# Patient Record
Sex: Female | Born: 1985 | Race: White | Hispanic: No | Marital: Married | Smoking: Never smoker
Health system: Southern US, Community
[De-identification: ages and names within clinical notes are randomized; demographics above are authoritative.]

## PROBLEM LIST (undated history)

## (undated) DIAGNOSIS — E161 Other hypoglycemia: Secondary | ICD-10-CM

## (undated) DIAGNOSIS — N200 Calculus of kidney: Secondary | ICD-10-CM

## (undated) DIAGNOSIS — E282 Polycystic ovarian syndrome: Secondary | ICD-10-CM

## (undated) HISTORY — DX: Other hypoglycemia: E16.1

## (undated) HISTORY — PX: CHOLECYSTECTOMY: SHX55

## (undated) HISTORY — PX: URETEROLITHOTOMY: SHX71

## (undated) HISTORY — PX: OTHER SURGICAL HISTORY: SHX169

## (undated) HISTORY — PX: URETERAL STENT PLACEMENT: SHX822

---
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
## 2009-05-22 ENCOUNTER — Ambulatory Visit: Payer: Self-pay | Admitting: Cardiology

## 2009-10-05 ENCOUNTER — Ambulatory Visit: Payer: Self-pay | Admitting: Cardiology

## 2009-10-05 DIAGNOSIS — R079 Chest pain, unspecified: Secondary | ICD-10-CM

## 2009-10-05 DIAGNOSIS — R002 Palpitations: Secondary | ICD-10-CM | POA: Insufficient documentation

## 2009-10-10 ENCOUNTER — Encounter: Payer: Self-pay | Admitting: Cardiology

## 2009-10-10 ENCOUNTER — Ambulatory Visit: Payer: Self-pay | Admitting: Cardiology

## 2009-10-18 ENCOUNTER — Telehealth (INDEPENDENT_AMBULATORY_CARE_PROVIDER_SITE_OTHER): Payer: Self-pay | Admitting: *Deleted

## 2010-11-06 NOTE — Progress Notes (Signed)
Summary: Wants test results  Phone Note Call from Patient Call back at [facility] Phone [phone]   Summary of Call: Pt called regarding results of stress echo. Notified we have not received these from [facility] yet. However, I will pull this report and have Dr. Diona Browner review. Pt verbalized understanding.  Initial call taken by: Cyril Loosen, RN, BSN,  October 18, 2009 4:48 PM

## 2017-04-11 LAB — OB RESULTS CONSOLE HEPATITIS B SURFACE ANTIGEN: HEP B S AG: NEGATIVE

## 2017-04-11 LAB — OB RESULTS CONSOLE ABO/RH: RH TYPE: POSITIVE

## 2017-04-11 LAB — OB RESULTS CONSOLE GC/CHLAMYDIA
Chlamydia: NEGATIVE
Gonorrhea: NEGATIVE

## 2017-04-11 LAB — OB RESULTS CONSOLE ANTIBODY SCREEN: ANTIBODY SCREEN: NEGATIVE

## 2017-04-11 LAB — OB RESULTS CONSOLE HIV ANTIBODY (ROUTINE TESTING): HIV: NONREACTIVE

## 2017-04-11 LAB — OB RESULTS CONSOLE RPR: RPR: NONREACTIVE

## 2017-04-11 LAB — OB RESULTS CONSOLE RUBELLA ANTIBODY, IGM: Rubella: IMMUNE

## 2017-06-25 ENCOUNTER — Other Ambulatory Visit (HOSPITAL_COMMUNITY): Payer: Self-pay | Admitting: Obstetrics and Gynecology

## 2017-06-25 DIAGNOSIS — Z3689 Encounter for other specified antenatal screening: Secondary | ICD-10-CM

## 2017-06-25 DIAGNOSIS — Z3A21 21 weeks gestation of pregnancy: Secondary | ICD-10-CM

## 2017-06-25 DIAGNOSIS — O99212 Obesity complicating pregnancy, second trimester: Secondary | ICD-10-CM

## 2017-06-25 DIAGNOSIS — IMO0002 Reserved for concepts with insufficient information to code with codable children: Secondary | ICD-10-CM

## 2017-06-25 DIAGNOSIS — Z0489 Encounter for examination and observation for other specified reasons: Secondary | ICD-10-CM

## 2017-07-02 ENCOUNTER — Encounter (HOSPITAL_COMMUNITY): Payer: Self-pay | Admitting: *Deleted

## 2017-07-04 ENCOUNTER — Encounter (HOSPITAL_COMMUNITY): Payer: Self-pay

## 2017-07-04 ENCOUNTER — Other Ambulatory Visit (HOSPITAL_COMMUNITY): Payer: Self-pay | Admitting: *Deleted

## 2017-07-04 ENCOUNTER — Ambulatory Visit (HOSPITAL_COMMUNITY)
Admission: RE | Admit: 2017-07-04 | Discharge: 2017-07-04 | Disposition: A | Discharge status: Home / Self Care | Payer: BLUE CROSS/BLUE SHIELD | Source: Ambulatory Visit | Attending: Obstetrics and Gynecology | Admitting: Obstetrics and Gynecology

## 2017-07-04 DIAGNOSIS — Z3689 Encounter for other specified antenatal screening: Secondary | ICD-10-CM | POA: Diagnosis not present

## 2017-07-04 DIAGNOSIS — O99212 Obesity complicating pregnancy, second trimester: Secondary | ICD-10-CM | POA: Diagnosis not present

## 2017-07-04 DIAGNOSIS — IMO0002 Reserved for concepts with insufficient information to code with codable children: Secondary | ICD-10-CM

## 2017-07-04 DIAGNOSIS — Z3A21 21 weeks gestation of pregnancy: Secondary | ICD-10-CM | POA: Insufficient documentation

## 2017-07-04 DIAGNOSIS — Z0489 Encounter for examination and observation for other specified reasons: Secondary | ICD-10-CM

## 2017-07-04 HISTORY — DX: Polycystic ovarian syndrome: E28.2

## 2017-07-04 HISTORY — DX: Calculus of kidney: N20.0

## 2017-07-18 ENCOUNTER — Other Ambulatory Visit: Payer: Self-pay

## 2017-07-22 ENCOUNTER — Encounter (HOSPITAL_COMMUNITY): Payer: Self-pay

## 2017-08-01 ENCOUNTER — Encounter (HOSPITAL_COMMUNITY): Payer: Self-pay

## 2017-08-01 ENCOUNTER — Other Ambulatory Visit (HOSPITAL_COMMUNITY): Payer: Self-pay | Admitting: Obstetrics and Gynecology

## 2017-08-01 ENCOUNTER — Ambulatory Visit (HOSPITAL_COMMUNITY)
Admission: RE | Admit: 2017-08-01 | Discharge: 2017-08-01 | Disposition: A | Discharge status: Home / Self Care | Payer: BLUE CROSS/BLUE SHIELD | Source: Ambulatory Visit | Attending: Obstetrics and Gynecology | Admitting: Obstetrics and Gynecology

## 2017-08-01 DIAGNOSIS — Z362 Encounter for other antenatal screening follow-up: Secondary | ICD-10-CM | POA: Insufficient documentation

## 2017-08-01 DIAGNOSIS — Z0489 Encounter for examination and observation for other specified reasons: Secondary | ICD-10-CM

## 2017-08-01 DIAGNOSIS — O99212 Obesity complicating pregnancy, second trimester: Secondary | ICD-10-CM

## 2017-08-01 DIAGNOSIS — Z3689 Encounter for other specified antenatal screening: Secondary | ICD-10-CM

## 2017-08-01 DIAGNOSIS — IMO0002 Reserved for concepts with insufficient information to code with codable children: Secondary | ICD-10-CM

## 2017-08-01 DIAGNOSIS — Z3A25 25 weeks gestation of pregnancy: Secondary | ICD-10-CM | POA: Insufficient documentation

## 2017-08-04 ENCOUNTER — Other Ambulatory Visit (HOSPITAL_COMMUNITY): Payer: Self-pay | Admitting: *Deleted

## 2017-08-04 DIAGNOSIS — IMO0002 Reserved for concepts with insufficient information to code with codable children: Secondary | ICD-10-CM

## 2017-08-04 DIAGNOSIS — Z0489 Encounter for examination and observation for other specified reasons: Secondary | ICD-10-CM

## 2017-08-29 ENCOUNTER — Ambulatory Visit (HOSPITAL_COMMUNITY)
Admission: RE | Admit: 2017-08-29 | Discharge: 2017-08-29 | Disposition: A | Discharge status: Home / Self Care | Payer: BLUE CROSS/BLUE SHIELD | Source: Ambulatory Visit | Attending: Obstetrics and Gynecology | Admitting: Obstetrics and Gynecology

## 2017-08-29 ENCOUNTER — Encounter (HOSPITAL_COMMUNITY): Payer: Self-pay

## 2017-08-29 DIAGNOSIS — Z362 Encounter for other antenatal screening follow-up: Secondary | ICD-10-CM | POA: Diagnosis not present

## 2017-08-29 DIAGNOSIS — O99212 Obesity complicating pregnancy, second trimester: Secondary | ICD-10-CM | POA: Insufficient documentation

## 2017-08-29 DIAGNOSIS — Z0489 Encounter for examination and observation for other specified reasons: Secondary | ICD-10-CM

## 2017-08-29 DIAGNOSIS — Z3A29 29 weeks gestation of pregnancy: Secondary | ICD-10-CM | POA: Diagnosis not present

## 2017-08-29 DIAGNOSIS — IMO0002 Reserved for concepts with insufficient information to code with codable children: Secondary | ICD-10-CM

## 2017-10-07 NOTE — L&D Delivery Note (Signed)
Operative Delivery Note Pt reached an anterior lip dilation and was extremely uncomfortable and wished to push.  She pushed well about an hour and advanced the vertex to a +3 station.  Throughout pushing the baby had variable decelerations to the 80-90 range that would recover to 120-130 between pushes.  The last 10 minutes of pushing, the baby was not recovering fully and the vertex was visible at the perineum with pushing. Because of the prolonged variable decelerations,  I advised proceeding with a vacuum assistance to expedite delivery.  At 10:01 PM a viable female was delivered via Vaginal, Vacuum Investment banker, operational(Extractor).  Presentation: vertex; Position: Right,, Occiput,, Anterior; Station: +3.  Verbal consent: obtained from family.  Risks and benefits discussed in detail.  Risks include, but are not limited to the risks of anesthesia, bleeding, infection, damage to maternal tissues, fetal cephalhematoma.   The softcup vacuum was applied to a +3 station and inflated to green zone.  With 2 pulls and one pop-off the vertex was delivered to crowning and the patient pushed out the remainder of the baby.    APGAR: 9, 9; weight 7 lb 1.2 oz (3210 g).   Placenta status:delivered spontaneously , .   Cord:  with the following complications: tight nuchal x 1 delivered through.    Anesthesia: Epidural and local lidocaine block  Instruments: Soft cup vacuum Episiotomy: None Lacerations: Right Sulcus;2nd degree;Perineal  And a rectal mucosal tear about 2 inches from perineum to the right of the midline. Suture Repair: 3.0 vicryl, 2-0 vicryl and 3-0 vicryl rapide Est. Blood Loss (mL): 500mL  The patient had a deep right sulcus tear with a defect through the rectal mucosa measuring about 1-2cm.  The rectal sphincter and skin surrounding the rectum were intact, so it was not a fourth degree laceration, but likely just an extension of the sulcal tear into the rectum.  She also had a large varicosity near the perineum that  bled briskly and was controlled with a figure of eight suture of 2-0 vicryl x 2 to gain hemostasis prior to proceeding with the repair. .    The rectal mucosa was repaired separately with 3-0 vicryl in a running suture and an additional layer of the same suture to reinforce.  The right sulcus was repaired with 3-0 vicryl rapide. The remainder of the perineum was repaired with 3-0 vicryl  rapide in the usual fashion. Rectal exam confirmed closure of the mucosa, and that the sphincter was intact.  Mom to postpartum.  Baby to Couplet care / Skin to Skin.  Amber Shannon 11/12/2017, 11:15 PM

## 2017-10-13 LAB — OB RESULTS CONSOLE GBS: STREP GROUP B AG: NEGATIVE

## 2017-11-05 ENCOUNTER — Telehealth (HOSPITAL_COMMUNITY): Payer: Self-pay | Admitting: *Deleted

## 2017-11-05 ENCOUNTER — Encounter (HOSPITAL_COMMUNITY): Payer: Self-pay | Admitting: *Deleted

## 2017-11-05 NOTE — Telephone Encounter (Signed)
Preadmission screen  

## 2017-11-08 ENCOUNTER — Inpatient Hospital Stay (HOSPITAL_COMMUNITY)
Admission: AD | Admit: 2017-11-08 | Payer: BLUE CROSS/BLUE SHIELD | Source: Ambulatory Visit | Admitting: Obstetrics and Gynecology

## 2017-11-11 MED ORDER — HYDRALAZINE HCL 20 MG/ML IJ SOLN: 10.0000 mg | Freq: Once | INTRAMUSCULAR | Status: DC | PRN

## 2017-11-11 MED ORDER — LABETALOL HCL 5 MG/ML IV SOLN: 20.0000 mg | INTRAVENOUS | Status: DC | PRN

## 2017-11-11 NOTE — H&P (Signed)
Amber Shannon is a 32 y.o. female G1P0 at 6740 4/7 weeks (EDD 11/08/17 by LMP c/w 9 week US) presenting for IOL for labile blood pressures the last few weeks of pregnancy.  She has been followed carefully with labs and antenatal testing and no evidence of preeclampsia.  She has no symptoms and no significant proteinuria.  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  She remained on her metformin 500mg  po BID throughout pregnancy and passed both an early glucola at 16 weeks and the standard testing at 28 weeks.    OB History    Gravida Para Term Preterm AB Living   1         0   SAB TAB Ectopic Multiple Live Births                 Past Medical History:  Diagnosis Date  . Hyperinsulinism   . Kidney stones   . PCOS (polycystic ovarian syndrome)    Past Surgical History:  Procedure Laterality Date  . CYSTOSCOPY  2007  . retrograde     right retrograde pyelogram  . URETERAL STENT PLACEMENT    . URETEROLITHOTOMY     Family History: family history is not on file. Social History:  reports that  has never smoked. she has never used smokeless tobacco. She reports that she uses drugs. She reports that she does not drink alcohol.     Maternal Diabetes: No Genetic Screening: Normal Maternal Ultrasounds/Referrals: Normal Fetal Ultrasounds or other Referrals:  None Maternal Substance Abuse:  No Significant Maternal Medications:  Meds include: Other: metformin  Significant Maternal Lab Results:  None Other Comments:  None  Review of Systems  Gastrointestinal: Negative for abdominal pain.  Neurological: Negative for headaches.   Maternal Medical History:  Contractions: Frequency: rare.   Perceived severity is mild.    Fetal activity: Perceived fetal activity is normal.    Prenatal complications: PIH.   Prenatal Complications - Diabetes: none.      Last  menstrual period 02/01/2017. Maternal Exam:  Uterine Assessment: Contraction strength is mild.  Contraction frequency is irregular.   Abdomen: Patient reports no abdominal tenderness. Fetal presentation: vertex  Introitus: Normal vulva. Normal vagina.    Physical Exam  Constitutional: She appears well-developed.  Cardiovascular: Normal rate and regular rhythm.  Respiratory: Effort normal.  GI: Soft.  Genitourinary: Vagina normal.  Neurological: She is alert.  Psychiatric: She has a normal mood and affect.    Prenatal labs: ABO, Rh: A/Positive/-- (07/06 0000) Antibody: Negative (07/06 0000) Rubella: Immune (07/06 0000) RPR: Nonreactive (07/06 0000)  HBsAg: Negative (07/06 0000)  HIV: Non-reactive (07/06 0000)  GBS: Negative (01/07 0000)  First trimester screen and AFP negative Essential panel negative  Assessment/Plan: Pt for IOL at 40+ weeks for postdates and some labile BP the last few weeks of pregnancy.  No s/s of preeclampsia.  Will admit and ripen with cytotec.  Plan AROM and pitocin when able.  Repeat labs.  Treat severe range BP.  Oliver PilaKathy W Zion Lint 11/11/2017, 5:46 PM

## 2017-11-12 ENCOUNTER — Encounter (HOSPITAL_COMMUNITY): Payer: Self-pay

## 2017-11-12 ENCOUNTER — Inpatient Hospital Stay (HOSPITAL_COMMUNITY)
Admission: RE | Admit: 2017-11-12 | Discharge: 2017-11-14 | DRG: 806 | Disposition: A | Discharge status: Home / Self Care | Payer: BLUE CROSS/BLUE SHIELD | Source: Ambulatory Visit | Attending: Obstetrics and Gynecology | Admitting: Obstetrics and Gynecology

## 2017-11-12 ENCOUNTER — Inpatient Hospital Stay (HOSPITAL_COMMUNITY): Payer: BLUE CROSS/BLUE SHIELD | Admitting: Anesthesiology

## 2017-11-12 DIAGNOSIS — O99214 Obesity complicating childbirth: Secondary | ICD-10-CM | POA: Diagnosis present

## 2017-11-12 DIAGNOSIS — O134 Gestational [pregnancy-induced] hypertension without significant proteinuria, complicating childbirth: Secondary | ICD-10-CM | POA: Diagnosis present

## 2017-11-12 DIAGNOSIS — O480 Post-term pregnancy: Secondary | ICD-10-CM | POA: Diagnosis present

## 2017-11-12 DIAGNOSIS — O24425 Gestational diabetes mellitus in childbirth, controlled by oral hypoglycemic drugs: Secondary | ICD-10-CM | POA: Diagnosis present

## 2017-11-12 DIAGNOSIS — O878 Other venous complications in the puerperium: Secondary | ICD-10-CM | POA: Diagnosis present

## 2017-11-12 DIAGNOSIS — Z3A40 40 weeks gestation of pregnancy: Secondary | ICD-10-CM | POA: Diagnosis not present

## 2017-11-12 LAB — RPR: RPR Ser Ql: NONREACTIVE

## 2017-11-12 LAB — PROTEIN / CREATININE RATIO, URINE
CREATININE, URINE: 119 mg/dL
PROTEIN CREATININE RATIO: 0.08 mg/mg{creat} (ref 0.00–0.15)
TOTAL PROTEIN, URINE: 10 mg/dL

## 2017-11-12 LAB — TYPE AND SCREEN
ABO/RH(D): A POS
Antibody Screen: NEGATIVE

## 2017-11-12 LAB — COMPREHENSIVE METABOLIC PANEL
ALBUMIN: 2.8 g/dL — AB (ref 3.5–5.0)
ALT: 17 U/L (ref 14–54)
AST: 17 U/L (ref 15–41)
Alkaline Phosphatase: 143 U/L — ABNORMAL HIGH (ref 38–126)
Anion gap: 10 (ref 5–15)
BILIRUBIN TOTAL: 0.4 mg/dL (ref 0.3–1.2)
BUN: 13 mg/dL (ref 6–20)
CO2: 18 mmol/L — ABNORMAL LOW (ref 22–32)
Calcium: 8.8 mg/dL — ABNORMAL LOW (ref 8.9–10.3)
Chloride: 105 mmol/L (ref 101–111)
Creatinine, Ser: 0.59 mg/dL (ref 0.44–1.00)
GFR calc non Af Amer: >60 mL/min (ref >60)
GLUCOSE: 95 mg/dL (ref 65–99)
POTASSIUM: 3.8 mmol/L (ref 3.5–5.1)
Sodium: 133 mmol/L — ABNORMAL LOW (ref 135–145)
TOTAL PROTEIN: 7 g/dL (ref 6.5–8.1)

## 2017-11-12 LAB — ABO/RH: ABO/RH(D): A POS

## 2017-11-12 LAB — CBC
HEMATOCRIT: 32.4 % — AB (ref 36.0–46.0)
HEMOGLOBIN: 10.5 g/dL — AB (ref 12.0–15.0)
MCH: 25 pg — ABNORMAL LOW (ref 26.0–34.0)
MCHC: 32.4 g/dL (ref 30.0–36.0)
MCV: 77.1 fL — ABNORMAL LOW (ref 78.0–100.0)
Platelets: 194 10*3/uL (ref 150–400)
RBC: 4.2 MIL/uL (ref 3.87–5.11)
RDW: 17.3 % — AB (ref 11.5–15.5)
WBC: 11.2 10*3/uL — ABNORMAL HIGH (ref 4.0–10.5)

## 2017-11-12 MED ORDER — OXYCODONE-ACETAMINOPHEN 5-325 MG PO TABS: 2.0000 | ORAL_TABLET | ORAL | Status: DC | PRN

## 2017-11-12 MED ORDER — OXYCODONE-ACETAMINOPHEN 5-325 MG PO TABS: 1.0000 | ORAL_TABLET | ORAL | Status: DC | PRN

## 2017-11-12 MED ORDER — LACTATED RINGERS IV SOLN
INTRAVENOUS | Status: DC
  Administered 2017-11-12: 17:00:00 via INTRAUTERINE

## 2017-11-12 MED ORDER — FENTANYL 2.5 MCG/ML BUPIVACAINE 1/10 % EPIDURAL INFUSION (WH - ANES)
14.0000 mL/h | INTRAMUSCULAR | Status: DC | PRN
  Administered 2017-11-12 (×2): 14 mL/h via EPIDURAL
  Filled 2017-11-12 (×2): qty 100

## 2017-11-12 MED ORDER — EPHEDRINE 5 MG/ML INJ
10.0000 mg | INTRAVENOUS | Status: DC | PRN
  Filled 2017-11-12: qty 2

## 2017-11-12 MED ORDER — BUTORPHANOL TARTRATE 1 MG/ML IJ SOLN: 1.0000 mg | INTRAMUSCULAR | Status: DC | PRN

## 2017-11-12 MED ORDER — PHENYLEPHRINE 40 MCG/ML (10ML) SYRINGE FOR IV PUSH (FOR BLOOD PRESSURE SUPPORT)
80.0000 ug | PREFILLED_SYRINGE | INTRAVENOUS | Status: DC | PRN
  Filled 2017-11-12: qty 10
  Filled 2017-11-12: qty 5

## 2017-11-12 MED ORDER — TERBUTALINE SULFATE 1 MG/ML IJ SOLN
0.2500 mg | Freq: Once | INTRAMUSCULAR | Status: DC | PRN
  Filled 2017-11-12: qty 1

## 2017-11-12 MED ORDER — LACTATED RINGERS IV SOLN
500.0000 mL | INTRAVENOUS | Status: DC | PRN
  Administered 2017-11-12: 500 mL via INTRAVENOUS

## 2017-11-12 MED ORDER — LIDOCAINE HCL (PF) 1 % IJ SOLN
30.0000 mL | INTRAMUSCULAR | Status: DC | PRN
  Administered 2017-11-12: 30 mL via SUBCUTANEOUS
  Filled 2017-11-12: qty 30

## 2017-11-12 MED ORDER — MISOPROSTOL 25 MCG QUARTER TABLET
25.0000 ug | ORAL_TABLET | ORAL | Status: AC
  Administered 2017-11-12 (×2): 25 ug via VAGINAL
  Filled 2017-11-12 (×2): qty 1

## 2017-11-12 MED ORDER — ACETAMINOPHEN 325 MG PO TABS: 650.0000 mg | ORAL_TABLET | ORAL | Status: DC | PRN

## 2017-11-12 MED ORDER — SOD CITRATE-CITRIC ACID 500-334 MG/5ML PO SOLN: 30.0000 mL | ORAL | Status: DC | PRN

## 2017-11-12 MED ORDER — DIPHENHYDRAMINE HCL 50 MG/ML IJ SOLN: 12.5000 mg | INTRAMUSCULAR | Status: DC | PRN

## 2017-11-12 MED ORDER — LIDOCAINE HCL (PF) 1 % IJ SOLN
INTRAMUSCULAR | Status: DC | PRN
  Administered 2017-11-12: 7 mL via EPIDURAL
  Administered 2017-11-12: 6 mL via EPIDURAL

## 2017-11-12 MED ORDER — LACTATED RINGERS IV SOLN: 500.0000 mL | Freq: Once | INTRAVENOUS | Status: DC

## 2017-11-12 MED ORDER — LACTATED RINGERS IV SOLN
INTRAVENOUS | Status: DC
  Administered 2017-11-12: 01:00:00 via INTRAVENOUS

## 2017-11-12 MED ORDER — ONDANSETRON HCL 4 MG/2ML IJ SOLN: 4.0000 mg | Freq: Four times a day (QID) | INTRAMUSCULAR | Status: DC | PRN

## 2017-11-12 MED ORDER — OXYTOCIN 40 UNITS IN LACTATED RINGERS INFUSION - SIMPLE MED: 2.5000 [IU]/h | INTRAVENOUS | Status: DC

## 2017-11-12 MED ORDER — OXYTOCIN BOLUS FROM INFUSION
500.0000 mL | Freq: Once | INTRAVENOUS | Status: AC
  Administered 2017-11-12: 500 mL via INTRAVENOUS

## 2017-11-12 MED ORDER — PHENYLEPHRINE 40 MCG/ML (10ML) SYRINGE FOR IV PUSH (FOR BLOOD PRESSURE SUPPORT)
80.0000 ug | PREFILLED_SYRINGE | INTRAVENOUS | Status: DC | PRN
  Administered 2017-11-12: 80 ug via INTRAVENOUS
  Filled 2017-11-12: qty 5

## 2017-11-12 MED ORDER — OXYTOCIN 40 UNITS IN LACTATED RINGERS INFUSION - SIMPLE MED
1.0000 m[IU]/min | INTRAVENOUS | Status: DC
  Administered 2017-11-12: 2 m[IU]/min via INTRAVENOUS
  Filled 2017-11-12: qty 1000

## 2017-11-12 NOTE — Progress Notes (Signed)
Patient ID: Amber Shannon, female   DOB: 02/06/1986, 32 y.o.   MRN: [id number] Pt received cytotec x 2 and feeling mild/mod contractions  FHR Category 1 Cervix 50/2-3/-2  AROM scant clear fluid  All PIH labs WNL on admission, prot:creat ratio normal BP stable since admission, highest 130/90  Plan is to start pitocin and increase as needed.

## 2017-11-12 NOTE — Progress Notes (Signed)
Patient ID: Amber Shannon, female   DOB: 03/09/1986, 32 y.o.   MRN: [id number] Pt comfortable with epidural  afeb VSS FHR category 1 with occasional mild variables  Cervix 80/3-4/-1  IUPC placed Pitocin at 4 mu, increase as needed for adequate MVU's  Follow progress

## 2017-11-12 NOTE — Anesthesia Pain Management Evaluation Note (Signed)
  CRNA Pain Management Visit Note  Patient: Amber Shannon, 32 y.o., female  "Hello I am a member of the anesthesia team at [facility]. We have an anesthesia team available at all times to provide care throughout the hospital, including epidural management and anesthesia for C-section. I don't know your plan for the delivery whether it a natural birth, water birth, IV sedation, nitrous supplementation, doula or epidural, but we want to meet your pain goals."   1.Was your pain managed to your expectations on prior hospitalizations?   No prior hospitalizations  2.What is your expectation for pain management during this hospitalization?     Epidural  3.How can we help you reach that goal? Epidural when ready  Record the patient's initial score and the patient's pain goal.   Pain: 2  Pain Goal: 7 [facility] wants you to be able to say your pain was always managed very well.  Cleda ClarksBrowder, Jennye Runquist R 11/12/2017

## 2017-11-12 NOTE — Anesthesia Preprocedure Evaluation (Signed)
Anesthesia Evaluation  Patient identified by MRN, date of birth, ID band Patient awake    Reviewed: Allergy & Precautions, H&P , NPO status , Patient's Chart, lab work & pertinent test results  Airway Mallampati: II  TM Distance: >3 FB Neck ROM: full    Dental no notable dental hx. (+) Teeth Intact   Pulmonary neg pulmonary ROS,    Pulmonary exam normal breath sounds clear to auscultation       Cardiovascular negative cardio ROS Normal cardiovascular exam Rhythm:regular Rate:Normal     Neuro/Psych negative neurological ROS  negative psych ROS   GI/Hepatic negative GI ROS, Neg liver ROS,   Endo/Other  Morbid obesity  Renal/GU   negative genitourinary   Musculoskeletal negative musculoskeletal ROS (+)   Abdominal (+) + obese,   Peds  Hematology  (+) Blood dyscrasia, anemia ,   Anesthesia Other Findings   Reproductive/Obstetrics (+) Pregnancy                             Anesthesia Physical Anesthesia Plan  ASA: III  Anesthesia Plan: Epidural   Post-op Pain Management:    Induction:   PONV Risk Score and Plan:   Airway Management Planned:   Additional Equipment:   Intra-op Plan:   Post-operative Plan:   Informed Consent: I have reviewed the patients History and Physical, chart, labs and discussed the procedure including the risks, benefits and alternatives for the proposed anesthesia with the patient or authorized representative who has indicated his/her understanding and acceptance.     Plan Discussed with:   Anesthesia Plan Comments:         Anesthesia Quick Evaluation

## 2017-11-12 NOTE — Progress Notes (Signed)
Patient ID: Amber Shannon, female   DOB: 06/08/1986, 32 y.o.   MRN: [id number] Pt feeling some pressure anteriorly with contraction L>R  afeb VSS  Cervix 90/5-6/-1  IUPC with adequate contractions. Had a run of variable decels that resolved with amnioinfusion, currently Category 1 with occasional mild early decels with contractions  Follow progress, hopefully entering more active phase

## 2017-11-12 NOTE — Anesthesia Procedure Notes (Signed)
Epidural Patient location during procedure: OB Start time: 11/12/2017 12:17 PM End time: 11/12/2017 12:20 PM  Staffing Anesthesiologist: Leilani AbleHatchett, Labradford Schnitker, MD Performed: anesthesiologist   Preanesthetic Checklist Completed: patient identified, site marked, surgical consent, pre-op evaluation, timeout performed, IV checked, risks and benefits discussed and monitors and equipment checked  Epidural Patient position: sitting Prep: site prepped and draped and DuraPrep Patient monitoring: continuous pulse ox and blood pressure Approach: midline Location: L3-L4 Injection technique: LOR air  Needle:  Needle type: Tuohy  Needle gauge: 17 G Needle length: 9 cm and 9 Needle insertion depth: 7 cm Catheter type: closed end flexible Catheter size: 19 Gauge Catheter at skin depth: 14 cm Test dose: negative and Other  Assessment Sensory level: T9 Events: blood not aspirated, injection not painful, no injection resistance, negative IV test and no paresthesia  Additional Notes Reason for block:procedure for pain

## 2017-11-12 NOTE — Progress Notes (Signed)
This note also relates to the following rows which could not be included: Dose (milli-units/min) Oxytocin - Cannot attach notes to extension rows Rate (mL/hr) Oxytocin - Cannot attach notes to extension rows Concentration Oxytocin - Cannot attach notes to extension rows  Previous issues with IV pump stating occluded on pitocin line.  Trouble shooting multiple times. Appears to be functioning properly at this time and will increase pitocin at this time.

## 2017-11-13 LAB — CBC
HEMATOCRIT: 26.3 % — AB (ref 36.0–46.0)
Hemoglobin: 8.7 g/dL — ABNORMAL LOW (ref 12.0–15.0)
MCH: 25.4 pg — ABNORMAL LOW (ref 26.0–34.0)
MCHC: 33.1 g/dL (ref 30.0–36.0)
MCV: 76.9 fL — AB (ref 78.0–100.0)
PLATELETS: 208 10*3/uL (ref 150–400)
RBC: 3.42 MIL/uL — AB (ref 3.87–5.11)
RDW: 16.9 % — ABNORMAL HIGH (ref 11.5–15.5)
WBC: 25.4 10*3/uL — AB (ref 4.0–10.5)

## 2017-11-13 LAB — HEMOGLOBIN AND HEMATOCRIT, BLOOD
HCT: 28.8 % — ABNORMAL LOW (ref 36.0–46.0)
HEMOGLOBIN: 9.4 g/dL — AB (ref 12.0–15.0)

## 2017-11-13 LAB — GLUCOSE, CAPILLARY: Glucose-Capillary: 146 mg/dL — ABNORMAL HIGH (ref 65–99)

## 2017-11-13 MED ORDER — COCONUT OIL OIL
1.0000 "application " | TOPICAL_OIL | Status: DC | PRN
  Filled 2017-11-13: qty 120

## 2017-11-13 MED ORDER — BENZOCAINE-MENTHOL 20-0.5 % EX AERO
1.0000 "application " | INHALATION_SPRAY | CUTANEOUS | Status: DC | PRN
  Filled 2017-11-13: qty 56

## 2017-11-13 MED ORDER — DOCUSATE SODIUM 100 MG PO CAPS
100.0000 mg | ORAL_CAPSULE | Freq: Two times a day (BID) | ORAL | Status: DC
  Administered 2017-11-14 (×2): 100 mg via ORAL
  Filled 2017-11-13 (×3): qty 1

## 2017-11-13 MED ORDER — TETANUS-DIPHTH-ACELL PERTUSSIS 5-2.5-18.5 LF-MCG/0.5 IM SUSP: 0.5000 mL | Freq: Once | INTRAMUSCULAR | Status: DC

## 2017-11-13 MED ORDER — ONDANSETRON HCL 4 MG/2ML IJ SOLN: 4.0000 mg | INTRAMUSCULAR | Status: DC | PRN

## 2017-11-13 MED ORDER — OXYCODONE HCL 5 MG PO TABS: 10.0000 mg | ORAL_TABLET | ORAL | Status: DC | PRN

## 2017-11-13 MED ORDER — ZOLPIDEM TARTRATE 5 MG PO TABS: 5.0000 mg | ORAL_TABLET | Freq: Every evening | ORAL | Status: DC | PRN

## 2017-11-13 MED ORDER — IBUPROFEN 600 MG PO TABS
600.0000 mg | ORAL_TABLET | Freq: Four times a day (QID) | ORAL | Status: DC
  Administered 2017-11-13 – 2017-11-14 (×6): 600 mg via ORAL
  Filled 2017-11-13 (×6): qty 1

## 2017-11-13 MED ORDER — PRENATAL MULTIVITAMIN CH
1.0000 | ORAL_TABLET | Freq: Every day | ORAL | Status: DC
  Administered 2017-11-13 – 2017-11-14 (×2): 1 via ORAL
  Filled 2017-11-13 (×2): qty 1

## 2017-11-13 MED ORDER — OXYCODONE HCL 5 MG PO TABS: 5.0000 mg | ORAL_TABLET | ORAL | Status: DC | PRN

## 2017-11-13 MED ORDER — WITCH HAZEL-GLYCERIN EX PADS: 1.0000 "application " | MEDICATED_PAD | CUTANEOUS | Status: DC | PRN

## 2017-11-13 MED ORDER — METFORMIN HCL 500 MG PO TABS
500.0000 mg | ORAL_TABLET | Freq: Two times a day (BID) | ORAL | Status: DC
  Administered 2017-11-13 – 2017-11-14 (×3): 500 mg via ORAL
  Filled 2017-11-13 (×3): qty 1

## 2017-11-13 MED ORDER — DIBUCAINE 1 % RE OINT: 1.0000 "application " | TOPICAL_OINTMENT | RECTAL | Status: DC | PRN

## 2017-11-13 MED ORDER — ACETAMINOPHEN 325 MG PO TABS: 650.0000 mg | ORAL_TABLET | ORAL | Status: DC | PRN

## 2017-11-13 MED ORDER — ONDANSETRON HCL 4 MG PO TABS: 4.0000 mg | ORAL_TABLET | ORAL | Status: DC | PRN

## 2017-11-13 MED ORDER — SIMETHICONE 80 MG PO CHEW: 80.0000 mg | CHEWABLE_TABLET | ORAL | Status: DC | PRN

## 2017-11-13 MED ORDER — SENNOSIDES-DOCUSATE SODIUM 8.6-50 MG PO TABS
2.0000 | ORAL_TABLET | ORAL | Status: DC
  Administered 2017-11-13 – 2017-11-14 (×2): 2 via ORAL
  Filled 2017-11-13 (×2): qty 2

## 2017-11-13 MED ORDER — LACTATED RINGERS IV BOLUS (SEPSIS): 1000.0000 mL | Freq: Once | INTRAVENOUS | Status: DC

## 2017-11-13 MED ORDER — SODIUM CHLORIDE 0.9 % IV SOLN
3.0000 g | Freq: Four times a day (QID) | INTRAVENOUS | Status: AC
  Administered 2017-11-13 (×2): 3 g via INTRAVENOUS
  Filled 2017-11-13 (×2): qty 3

## 2017-11-13 MED ORDER — DIPHENHYDRAMINE HCL 25 MG PO CAPS: 25.0000 mg | ORAL_CAPSULE | Freq: Four times a day (QID) | ORAL | Status: DC | PRN

## 2017-11-13 NOTE — Progress Notes (Signed)
Patient ID: Amber Shannon, female   DOB: 04/20/1986, 32 y.o.   MRN: [id number] Pt noted to have WBC of 25K.  Given complicated laceration involving rectal mucosa will give 2 doses of unasyn,  Afebrile

## 2017-11-13 NOTE — Progress Notes (Signed)
CRNA and Anesthesiologist Dr. Desmond Lopeurk notified of patient still saying her left leg has mild numbness. Patient is ambulating, voiding, getting in and out of bed independently. He told RN to reassure her that its probably from pushing and will resolve, but to call him if any symptoms worsen. Night shift RN aware. Will continue to monitor. Royston CowperIsley, Doaa Kendzierski E, RN

## 2017-11-13 NOTE — Progress Notes (Signed)
PPD #1 No problems Afeb, VSS, BP normal Fundus firm, NT at U-1 Continue routine postpartum care 

## 2017-11-13 NOTE — Progress Notes (Signed)
Patient states that her left upper leg is still numb/heavy. Patient can lift hips off the bed, can entire leg off the bed, and is voiding. RN will attempt to stand and walk patient when she needs the bathroom again. Will continue to monitor. Royston CowperIsley, Jaxten Brosh E, RN

## 2017-11-13 NOTE — Anesthesia Postprocedure Evaluation (Signed)
Anesthesia Post Note  Patient: Amber Shannon  Procedure(s) Performed: AN AD HOC LABOR EPIDURAL     Patient location during evaluation: Mother Baby Anesthesia Type: Epidural Level of consciousness: awake and alert and oriented Pain management: satisfactory to patient Vital Signs Assessment: post-procedure vital signs reviewed and stable Respiratory status: spontaneous breathing and nonlabored ventilation Cardiovascular status: stable Postop Assessment: no headache, no backache, no signs of nausea or vomiting, adequate PO intake and patient able to bend at knees (patient up walking) Anesthetic complications: no    Last Vitals:  Vitals:   11/13/17 0445 11/13/17 0745  BP:  123/75  Pulse: 98 96  Resp:  18  Temp:  36.6 C  SpO2:  100%    Last Pain:  Vitals:   11/13/17 0745  TempSrc: Oral  PainSc: 0-No pain   Pain Goal: Patients Stated Pain Goal: 7 (11/12/17 0754)               Madison HickmanGREGORY,Jameel Quant

## 2017-11-13 NOTE — Progress Notes (Signed)
Patient feels that it is less heavy/numb than this morning and that it is better. She is ambulating independently, getting in and out of bed independently, voiding, and can lift hips off the bed. She said she discussed it with anesthesia this AM on their rounds and feels that it is due to positioning during labor.  I made Dr. Senaida Oresichardson aware of it this morning when she called to notify RN that she was starting antibiotics on pt. Amber Shannon, Amber Shannon E, RN

## 2017-11-13 NOTE — Lactation Note (Addendum)
This note was copied from a baby's chart. Lactation Consultation Note Baby 8 hrs old, just had a large emesis, noted slight abd. Distension.  Mom has PCOS. Flat chest, wide space, appears that breast tissue may not separate between breast. Feels continuous.  Has very short shaft nipples, w/finger stimulation can evert nipple some and compress behind nipple. Shells given and hand pump to pre-pump before latching. Hand expression taught w/hardly a glisten at all.  Mom shown how to use DEBP & how to disassemble, clean, & reassemble parts. Mom knows to pump q3h for 15-20 min. Discussed supply and demand. Mom pumping when LC finished consult.  Mom encouraged to feed baby 8-12 times/24 hours and with feeding cues. Newborn feeding habits, STS, I&O, cluster feeding, discussed. WH/LC brochure given w/resources, support groups and LC services.  Patient Name: Amber Celene SkeenRebekah Shannon WUJWJ'XToday's Date: 11/13/2017 Reason for consult: Initial assessment;Maternal endocrine disorder Type of Endocrine Disorder?: PCOS(hyperinsulinemia)   Maternal Data Has patient been taught Hand Expression?: Yes Does the patient have breastfeeding experience prior to this delivery?: No  Feeding Feeding Type: Breast Fed Length of feed: 15 min  LATCH Score Latch: Too sleepy or reluctant, no latch achieved, no sucking elicited.  Audible Swallowing: None  Type of Nipple: Everted at rest and after stimulation(very short shaft)  Comfort (Breast/Nipple): Soft / non-tender        Interventions Interventions: Breast feeding basics reviewed;Breast compression;Shells;Breast massage;Hand pump;DEBP;Position options;Hand express  Lactation Tools Discussed/Used Tools: Pump;Shells Shell Type: Inverted Breast pump type: Double-Electric Breast Pump;Manual WIC Program: No Pump Review: Setup, frequency, and cleaning;Milk Storage Initiated by:: Peri JeffersonL. Gabriellia Rempel RN IBCLC Date initiated:: 11/13/17   Consult Status Consult Status:  Follow-up Date: 11/13/17 Follow-up type: In-patient    [name], Diamond NickelLAURA G 11/13/2017, 6:08 AM

## 2017-11-14 LAB — CBC
HCT: 23.2 % — ABNORMAL LOW (ref 36.0–46.0)
Hemoglobin: 7.7 g/dL — ABNORMAL LOW (ref 12.0–15.0)
MCH: 26 pg (ref 26.0–34.0)
MCHC: 33.2 g/dL (ref 30.0–36.0)
MCV: 78.4 fL (ref 78.0–100.0)
Platelets: 188 10*3/uL (ref 150–400)
RBC: 2.96 MIL/uL — ABNORMAL LOW (ref 3.87–5.11)
RDW: 17 % — ABNORMAL HIGH (ref 11.5–15.5)
WBC: 14.3 10*3/uL — AB (ref 4.0–10.5)

## 2017-11-14 LAB — BIRTH TISSUE RECOVERY COLLECTION (PLACENTA DONATION)

## 2017-11-14 MED ORDER — DOCUSATE SODIUM 100 MG PO CAPS: 100.0000 mg | ORAL_CAPSULE | Freq: Two times a day (BID) | ORAL | 2 refills | Status: DC

## 2017-11-14 MED ORDER — IBUPROFEN 600 MG PO TABS: 600.0000 mg | ORAL_TABLET | Freq: Four times a day (QID) | ORAL | 1 refills | Status: DC | PRN

## 2017-11-14 NOTE — Lactation Note (Signed)
This note was copied from a baby's chart. Lactation Consultation Note  Patient Name: Amber Celene SkeenRebekah Citron UYQIH'KToday's Date: 11/14/2017 Reason for consult: Follow-up assessment Type of Endocrine Disorder?: PCOS Parents just finished a feeding using the SNS.  They stated feeding went well and infant took 18 mls of formula.  Mom getting ready to pump.  She has a pump at home.  Answered questions.  Lactation outpatient services reviewed and encouraged prn.  Maternal Data    Feeding Feeding Type: Breast Fed Length of feed: 30 min  LATCH Score Latch: Grasps breast easily, tongue down, lips flanged, rhythmical sucking.  Audible Swallowing: None  Type of Nipple: Everted at rest and after stimulation(semi flat/very short shaft)  Comfort (Breast/Nipple): Filling, red/small blisters or bruises, mild/mod discomfort  Hold (Positioning): Full assist, staff holds infant at breast  LATCH Score: 5  Interventions Interventions: Breast feeding basics reviewed;Reverse pressure;Coconut oil;Breast compression;Shells;Assisted with latch;Skin to skin;Adjust position;Comfort gels;Support pillows;Breast massage;Hand express;Position options  Lactation Tools Discussed/Used Tools: Shells;Supplemental Nutrition System;Coconut oil;Comfort gels;Nipple Shields Nipple shield size: 16;20 Shell Type: Inverted Breast pump type: Double-Electric Breast Pump   Consult Status Consult Status: Follow-up Date: 11/14/17 Follow-up type: In-patient    Huston FoleyMOULDEN, Jose Corvin S 11/14/2017, 10:12 AM

## 2017-11-14 NOTE — Discharge Summary (Signed)
OB Discharge Summary     Patient Name: Amber Shannon DOB: 1985-11-16 MRN: [id number]  Date of admission: 11/12/2017 Delivering MD: Huel Cote   Date of discharge: 11/14/2017  Admitting diagnosis: INDUCTION Intrauterine pregnancy: [redacted]w[redacted]d     Secondary diagnosis:  Active Problems:   Indication for care in labor and delivery, antepartum   Vacuum extractor delivery, delivered  Additional problems: none     Discharge diagnosis: Term Pregnancy Delivered, Gestational Hypertension and GDM A2                                                                                                Post partum procedures:none  Augmentation: AROM, Pitocin and Cytotec  Complications: None  Hospital course:  Induction of Labor With Vaginal Delivery   32 y.o. yo G1P1001 at [redacted]w[redacted]d was admitted to the hospital 11/12/2017 for induction of labor.  Indication for induction: Gestational hypertension.  Patient had an uncomplicated labor course as follows: Membrane Rupture Time/Date: 8:45 AM ,11/12/2017   Intrapartum Procedures: Episiotomy: None [1]                                         Lacerations:  Sulcus [9];2nd degree [3];Perineal [11]  Patient had delivery of a Viable infant.  Information for the patient's newborn:  Brendi, Mccarroll [[id number]]  Delivery Method: Vaginal, Vacuum (Extractor)(Filed from Delivery Summary)   11/12/2017  Details of delivery can be found in separate delivery note.  Patient had a routine postpartum course. Patient is discharged home 11/14/17.  Physical exam  Vitals:   11/13/17 0445 11/13/17 0745 11/13/17 1832 11/14/17 0629  BP:  123/75 125/71 126/72  Pulse: 98 96 (!) 107 92  Resp:  18 18 16   Temp:  97.9 F (36.6 C) 98.9 F (37.2 C) 97.9 F (36.6 C)  TempSrc:  Oral Oral Oral  SpO2:  100%  98%  Weight:      Height:       General: alert, cooperative and no distress Lochia: appropriate Uterine Fundus: firm Incision: N/A DVT Evaluation: No evidence of DVT seen  on physical exam. Labs: Lab Results  Component Value Date   WBC 14.3 (H) 11/14/2017   HGB 7.7 (L) 11/14/2017   HCT 23.2 (L) 11/14/2017   MCV 78.4 11/14/2017   PLT 188 11/14/2017   CMP Latest Ref Rng & Units 11/12/2017  Glucose 65 - 99 mg/dL 95  BUN 6 - 20 mg/dL 13  Creatinine 3.08 - 6.57 mg/dL 8.46  Sodium 962 - 952 mmol/L 133(L)  Potassium 3.5 - 5.1 mmol/L 3.8  Chloride 101 - 111 mmol/L 105  CO2 22 - 32 mmol/L 18(L)  Calcium 8.9 - 10.3 mg/dL 8.4(X)  Total Protein 6.5 - 8.1 g/dL 7.0  Total Bilirubin 0.3 - 1.2 mg/dL 0.4  Alkaline Phos 38 - 126 U/L 143(H)  AST 15 - 41 U/L 17  ALT 14 - 54 U/L 17    Discharge instruction: per After Visit Summary and "Baby and Me Booklet".  After  visit meds:  Allergies as of 11/14/2017   No Known Allergies     Medication List    STOP taking these medications   metFORMIN 500 MG tablet Commonly known as:  GLUCOPHAGE     TAKE these medications   docusate sodium 100 MG capsule Commonly known as:  COLACE Take 1 capsule (100 mg total) by mouth 2 (two) times daily.   ferrous sulfate 325 (65 FE) MG tablet Take 325 mg by mouth daily with breakfast.   ibuprofen 600 MG tablet Commonly known as:  ADVIL,MOTRIN Take 1 tablet (600 mg total) by mouth every 6 (six) hours as needed.   PNV PO Take by mouth.       Diet: routine diet  Activity: Advance as tolerated. Pelvic rest for 6 weeks.   Outpatient follow up:6 weeks Follow up Appt:No future appointments. Follow up Visit:No Follow-up on file.  Postpartum contraception: Not Discussed  Newborn Data: Live born female  Birth Weight: 7 lb 1.2 oz (3210 g) APGAR: 9, 9  Newborn Delivery   Birth date/time:  11/12/2017 22:01:00 Delivery type:  Vaginal, Vacuum (Extractor)     Baby Feeding: Bottle and Breast Disposition:home with mother   11/14/2017 Amber Musterecilia W Bryn Saline, DO

## 2017-11-14 NOTE — Lactation Note (Signed)
This note was copied from a baby's chart. Lactation Consultation Note Baby 32 hrs old. Had 7% weight loss. Mom has very short shaft nipples. Baby unable to obtain deep latch. Mom nipples are sore. Lt. Nipple cracked. Comfort gels given. Mom hasn't worn shells given from previous day. Mom putting them on w/bra at this time.  Coconut oil given for pumping, explained not to apply w/comfort gels.  Mom fitted for #20 NS. Applied to Rt. Nipple. Baby BF 15 min. W/NO colostrum noted in NS. Fitted mom w/#16 NS. Similac 19 cal. Started as supplement using SNS. Fitted Rt. Nipple #16 NS, fits well. Lt. Breast will need #20 NS. Baby hasn't fed on that side yet.  Baby appears slightly jaundice. 32 hrs old TCB 7.9. Intermediate.  Mom stated baby has been BF around every 3-4 hrs. Discussed w/mom at baby's age that wasn't often enough, mom needs to wake baby up for feeding every 3 hrs at most.  SNS started. Taught parents set up/cleaning and feeding. Mom demonstrated application of NS. Stressed importance of obtaining deep latch and breast compression.  Parents has good documentation of I&O.  Baby fed well.  Encouraged mom to post pump d/t PCOS, difficulty to assess breast tissue. LC concerned as well as mom if will be able to exclusively BF w/o supplementation.   Patient Name: Amber Celene SkeenRebekah Shannon HKVQQ'VToday's Date: 11/14/2017 Reason for consult: Follow-up assessment Type of Endocrine Disorder?: PCOS   Maternal Data    Feeding Feeding Type: Breast Fed Length of feed: 30 min  LATCH Score Latch: Grasps breast easily, tongue down, lips flanged, rhythmical sucking.  Audible Swallowing: None  Type of Nipple: Everted at rest and after stimulation(semi flat/very short shaft)  Comfort (Breast/Nipple): Filling, red/small blisters or bruises, mild/mod discomfort  Hold (Positioning): Full assist, staff holds infant at breast  LATCH Score: 5  Interventions Interventions: Breast feeding basics reviewed;Reverse  pressure;Coconut oil;Breast compression;Shells;Assisted with latch;Skin to skin;Adjust position;Comfort gels;Support pillows;Breast massage;Hand express;Position options  Lactation Tools Discussed/Used Tools: Shells;Supplemental Nutrition System;Coconut oil;Comfort gels;Nipple Shields Nipple shield size: 16;20 Shell Type: Inverted Breast pump type: Double-Electric Breast Pump   Consult Status Consult Status: Follow-up Date: 11/14/17 Follow-up type: In-patient    Charyl DancerCARVER, Amber Shannon 11/14/2017, 6:32 AM

## 2017-11-14 NOTE — Discharge Instructions (Signed)
Nothing in vagina for 6 weeks.  No sex, tampons, and douching.  Other instructions as in [facility] Discharge Booklet. °

## 2017-11-14 NOTE — Plan of Care (Signed)
Patient denies pain, is ambulating independently, and feels confident with providing care for the baby.

## 2017-11-14 NOTE — Progress Notes (Signed)
Post Partum Day 2 Subjective: no complaints, up ad lib, voiding, tolerating PO, + flatus and pain well controlled. Bonding well with baby - breastfeeding  and supplementing. No fever or chills. Ready for discharge to home today  Objective: Blood pressure 126/72, pulse 92, temperature 97.9 F (36.6 C), temperature source Oral, resp. rate 16, height 5\' 6"  (1.676 m), weight 259 lb (117.5 kg), last menstrual period 02/01/2017, SpO2 98 %, unknown if currently breastfeeding.  Physical Exam:  General: alert, cooperative and no distress Lochia: appropriate Uterine Fundus: firm Incision: n/a DVT Evaluation: No evidence of DVT seen on physical exam.  Recent Labs    11/13/17 0112 11/13/17 0603  HGB 9.4* 8.7*  HCT 28.8* 26.3*    Assessment/Plan: Discharge home after recheck cbc - if trending down will ok discharge Instructions reviewed   LOS: 2 days   Amber Shannon 11/14/2017, 9:18 AM

## 2017-11-14 NOTE — Lactation Note (Signed)
This note was copied from a baby's chart. Lactation Consultation Note LC feels that baby may need to stay another night to assess feeding. LC concerned w/ moms supply, has PCOS and ? Adequate breast tissue. NS used w/no colostrum noted in NS. SNS started w/supplement of Similac. Baby looks jaundice. In high intermittent range.  Spoke w/mom of possible need to stay another night for BF assistance and assessment of milk supply.  Adequate out put for hours of age. For Md to decide. If Md wants baby to go home today, LC recommends late afternoon and to [facility]  Patient Name: Girl Celene SkeenRebekah Diniz EAVWU'JToday's Date: 11/14/2017 Reason for consult: Follow-up assessment Type of Endocrine Disorder?: PCOS   Maternal Data    Feeding Feeding Type: Breast Fed Length of feed: 30 min  LATCH Score Latch: Grasps breast easily, tongue down, lips flanged, rhythmical sucking.  Audible Swallowing: None  Type of Nipple: Everted at rest and after stimulation(semi flat/very short shaft)  Comfort (Breast/Nipple): Filling, red/small blisters or bruises, mild/mod discomfort  Hold (Positioning): Full assist, staff holds infant at breast  LATCH Score: 5  Interventions Interventions: Breast feeding basics reviewed;Reverse pressure;Coconut oil;Breast compression;Shells;Assisted with latch;Skin to skin;Adjust position;Comfort gels;Support pillows;Breast massage;Hand express;Position options  Lactation Tools Discussed/Used Tools: Shells;Supplemental Nutrition System;Coconut oil;Comfort gels;Nipple Shields Nipple shield size: 16;20 Shell Type: Inverted Breast pump type: Double-Electric Breast Pump   Consult Status Consult Status: Follow-up Date: 11/14/17 Follow-up type: In-patient    Riane Rung, Diamond NickelLAURA G 11/14/2017, 7:05 AM

## 2018-05-08 ENCOUNTER — Other Ambulatory Visit (HOSPITAL_COMMUNITY): Payer: Self-pay | Admitting: Physician Assistant

## 2018-05-08 DIAGNOSIS — R1013 Epigastric pain: Secondary | ICD-10-CM

## 2018-05-13 ENCOUNTER — Ambulatory Visit (HOSPITAL_COMMUNITY): Payer: BLUE CROSS/BLUE SHIELD

## 2018-05-15 ENCOUNTER — Ambulatory Visit (HOSPITAL_COMMUNITY)
Admission: RE | Admit: 2018-05-15 | Discharge: 2018-05-15 | Disposition: A | Discharge status: Home / Self Care | Payer: BLUE CROSS/BLUE SHIELD | Source: Ambulatory Visit | Attending: Physician Assistant | Admitting: Physician Assistant

## 2018-05-15 DIAGNOSIS — K8020 Calculus of gallbladder without cholecystitis without obstruction: Secondary | ICD-10-CM | POA: Diagnosis not present

## 2018-05-15 DIAGNOSIS — R1013 Epigastric pain: Secondary | ICD-10-CM

## 2019-10-08 NOTE — L&D Delivery Note (Signed)
Delivery Note Pt reached complete dilation and a +2 station then pushed well about 15 minutes and at 9:49 PM a healthy female was delivered via Vaginal, Spontaneous (Presentation: Left Occiput Anterior).  APGAR: 8, 9; weight  pending.   Placenta status: Spontaneous, Intact.  Cord: 3 vessels with the following complications: None.   Anesthesia: Epidural Episiotomy: None Lacerations: 2nd degree Suture Repair: 3.0 vicryl rapide Est. Blood Loss (mL):   Mom to postpartum.  Baby to Couplet care / Skin to Skin.  Amber Shannon 10/03/2020, 10:15 PM

## 2020-01-15 IMAGING — US US ABDOMEN COMPLETE
1 series · 14 of 25 positions shown · non-contrast
Comparison: None.

CLINICAL DATA: Abdominal pain.  Epigastric pain.

EXAM:
ABDOMEN ULTRASOUND COMPLETE

[Series 1: us abdomen complete · 0.28mm/px · 14 of 91 slices shown]
[im 1/91]
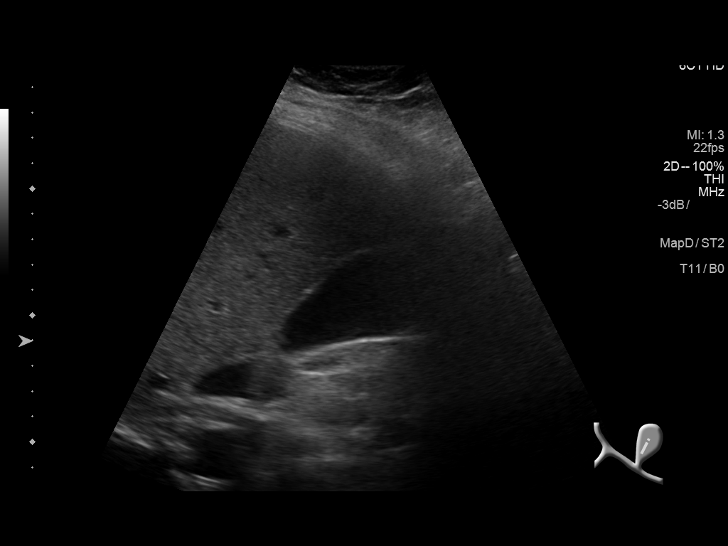
[im 8/91]
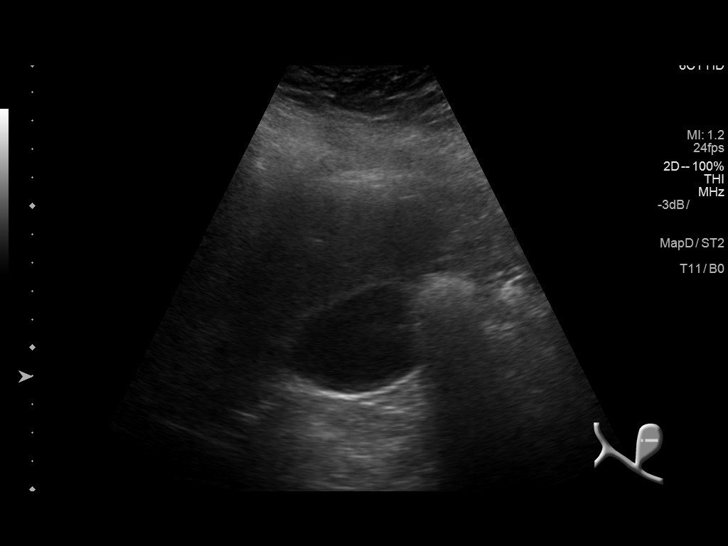
[im 16/91]
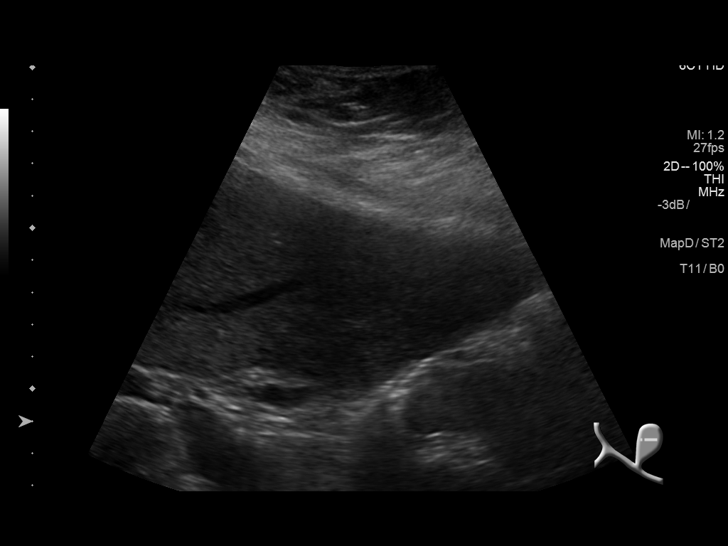
[im 23/91]
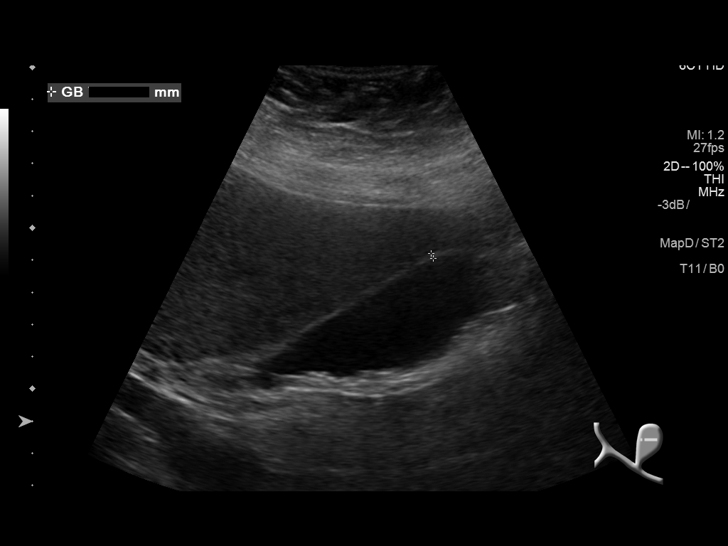
[im 31/91]
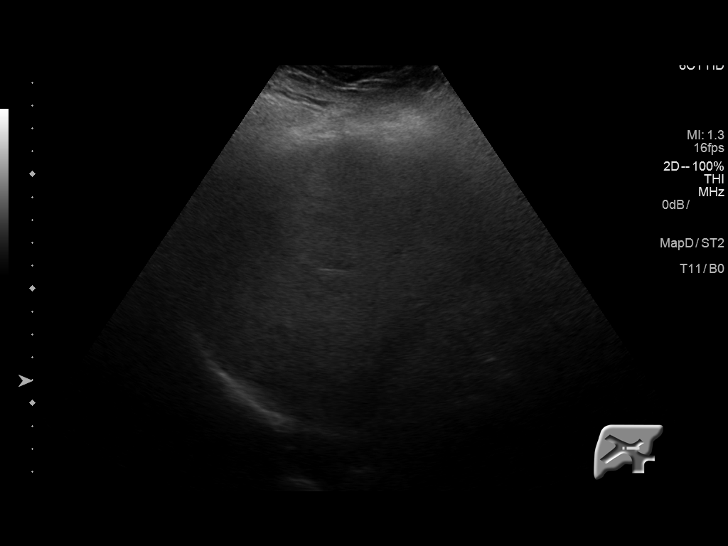
[im 34/91]
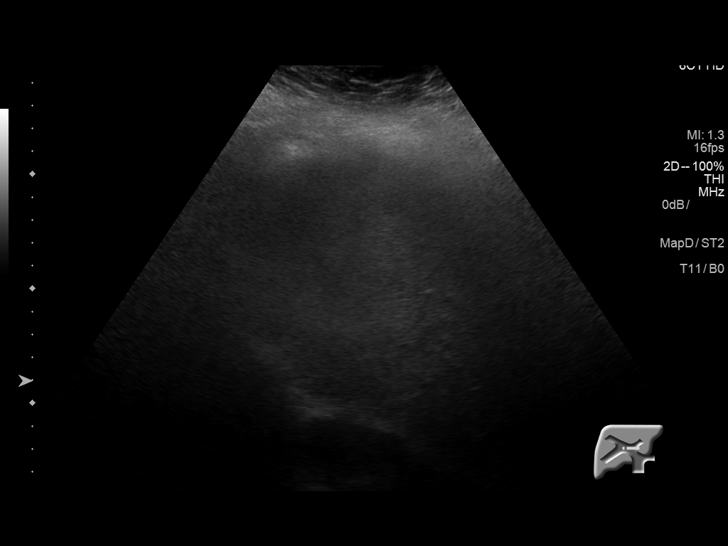
[im 42/91]
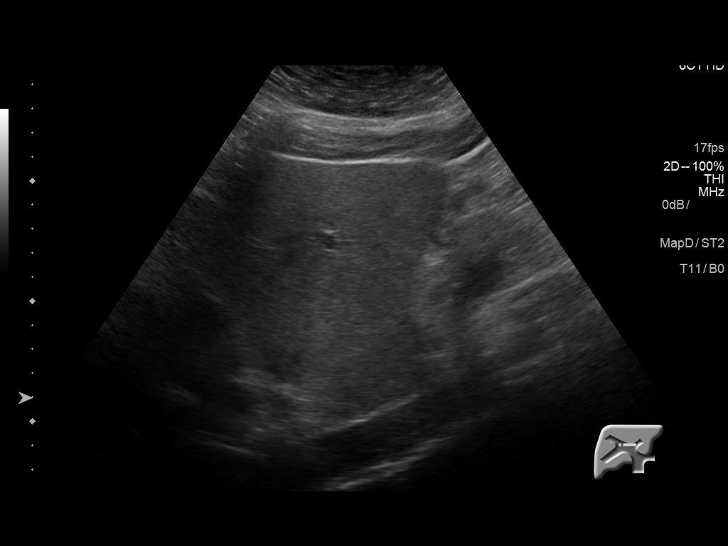
[im 49/91]
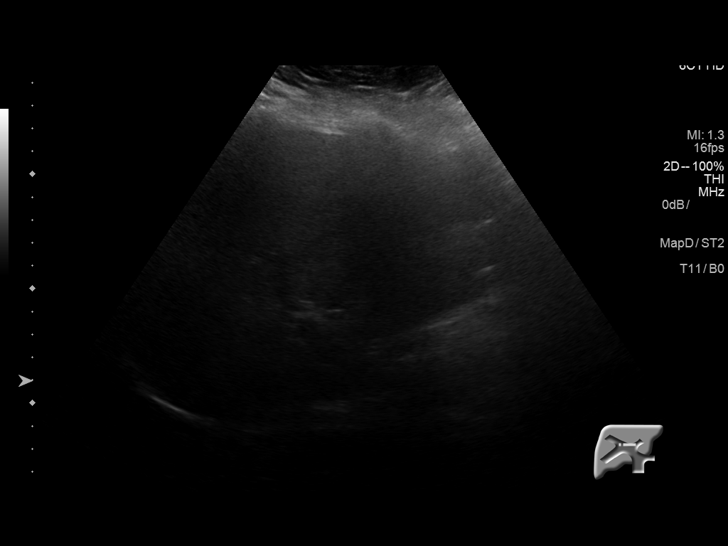
[im 57/91]
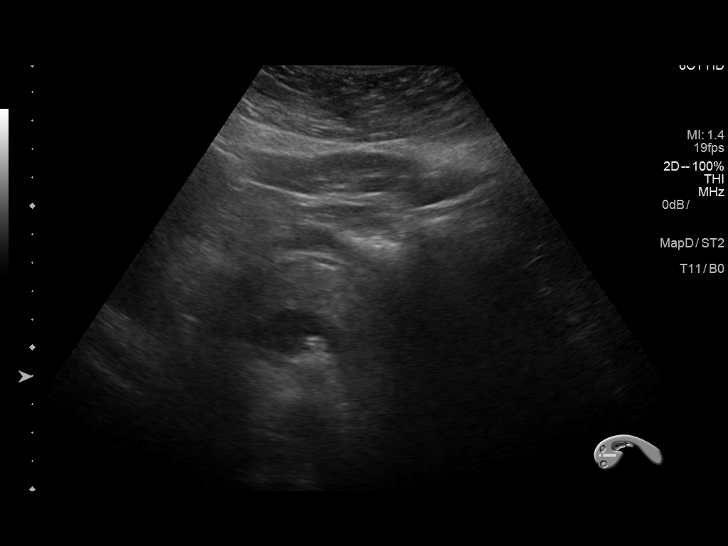
[im 61/91]
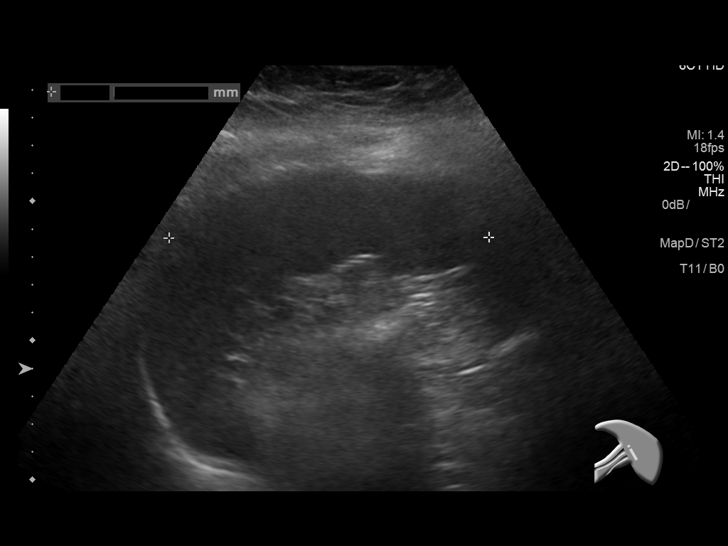
[im 68/91]
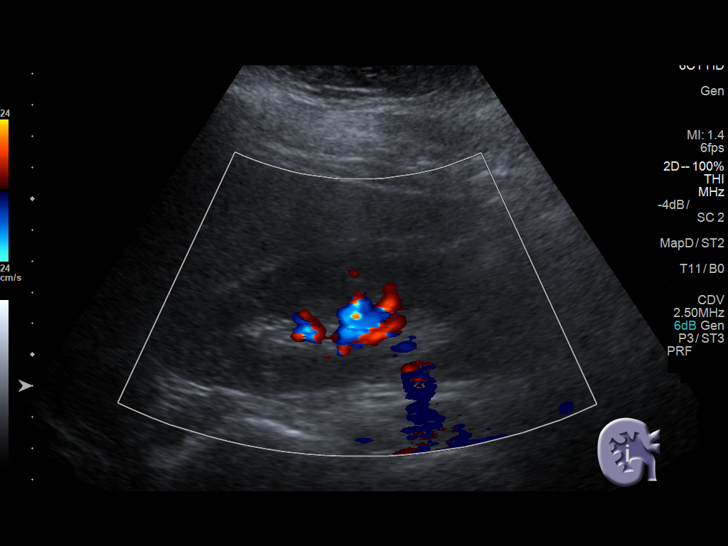
[im 76/91]
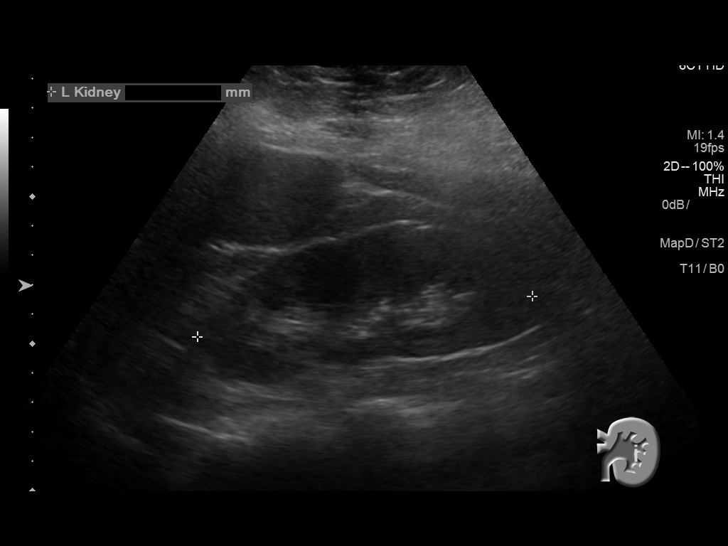
[im 83/91]
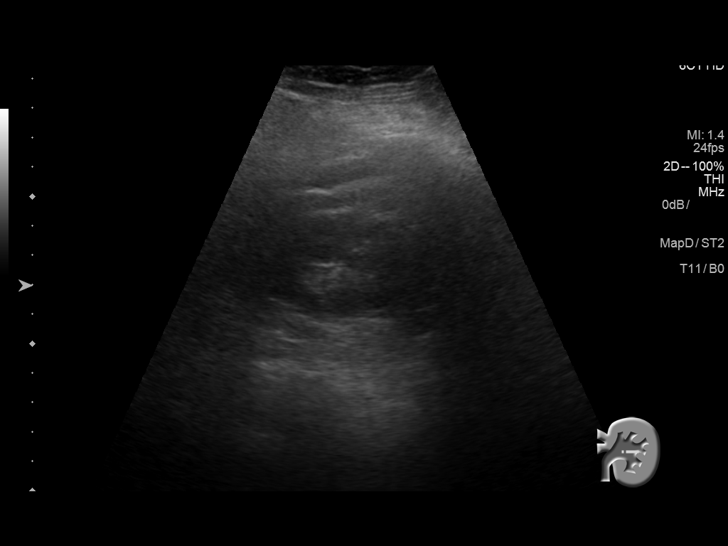
[im 91/91]
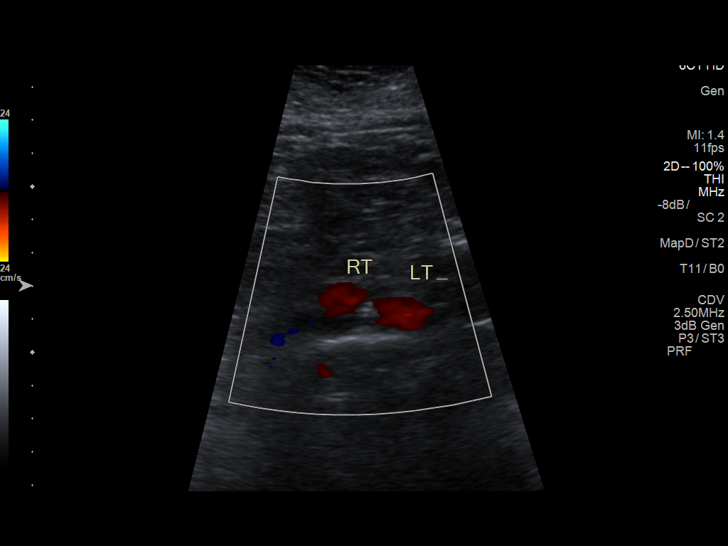

[14 of 25 positions shown; findings below may reference images not displayed]

FINDINGS: Gallbladder: There are layering stones and sludge in the
gallbladder. No wall thickening, pericholecystic fluid, or Murphy's
sign.

Common bile duct: Diameter: 3.9 mm.

Liver: Increased echogenicity in the liver with no focal mass.
Portal vein is patent on color Doppler imaging with normal direction
of blood flow towards the liver.

IVC: No abnormality visualized.

Pancreas: Visualized portion unremarkable.

Spleen: Size and appearance within normal limits.

Right Kidney: Length: 11.9 cm. Echogenicity within normal limits. No
mass or hydronephrosis visualized.

Left Kidney: Length: 11.5 cm. Echogenicity within normal limits. No
mass or hydronephrosis visualized.

Abdominal aorta: No aneurysm visualized.

Other findings: None.
IMPRESSION: 1. Cholelithiasis and sludge in the gallbladder with no wall
thickening, pericholecystic fluid, or Murphy's sign.
2. Probable hepatic steatosis.

## 2020-02-29 LAB — OB RESULTS CONSOLE ABO/RH: RH Type: POSITIVE

## 2020-02-29 LAB — OB RESULTS CONSOLE GC/CHLAMYDIA
Chlamydia: NEGATIVE
Gonorrhea: NEGATIVE

## 2020-02-29 LAB — OB RESULTS CONSOLE HIV ANTIBODY (ROUTINE TESTING): HIV: NONREACTIVE

## 2020-02-29 LAB — OB RESULTS CONSOLE RUBELLA ANTIBODY, IGM: Rubella: IMMUNE

## 2020-02-29 LAB — OB RESULTS CONSOLE RPR: RPR: NONREACTIVE

## 2020-02-29 LAB — OB RESULTS CONSOLE ANTIBODY SCREEN: Antibody Screen: NEGATIVE

## 2020-02-29 LAB — OB RESULTS CONSOLE HEPATITIS B SURFACE ANTIGEN: Hepatitis B Surface Ag: NEGATIVE

## 2020-07-06 LAB — OB RESULTS CONSOLE HGB/HCT, BLOOD
HCT: 32 (ref 29–41)
Hemoglobin: 10.4

## 2020-07-06 LAB — OB RESULTS CONSOLE PLATELET COUNT: Platelets: 220

## 2020-09-07 ENCOUNTER — Encounter (HOSPITAL_COMMUNITY): Payer: Self-pay | Admitting: *Deleted

## 2020-09-07 ENCOUNTER — Telehealth (HOSPITAL_COMMUNITY): Payer: Self-pay | Admitting: *Deleted

## 2020-09-07 LAB — OB RESULTS CONSOLE GBS: GBS: NEGATIVE

## 2020-09-07 NOTE — Telephone Encounter (Signed)
Preadmission screen  

## 2020-09-08 ENCOUNTER — Encounter (HOSPITAL_COMMUNITY): Payer: Self-pay | Admitting: *Deleted

## 2020-09-08 ENCOUNTER — Telehealth (HOSPITAL_COMMUNITY): Payer: Self-pay | Admitting: *Deleted

## 2020-09-08 NOTE — Telephone Encounter (Signed)
Preadmission screen  

## 2020-09-11 ENCOUNTER — Other Ambulatory Visit (HOSPITAL_COMMUNITY): Payer: BC Managed Care – PPO

## 2020-09-12 ENCOUNTER — Observation Stay (HOSPITAL_COMMUNITY): Admission: RE | Admit: 2020-09-12 | Payer: BC Managed Care – PPO | Source: Ambulatory Visit

## 2020-09-19 ENCOUNTER — Telehealth (HOSPITAL_COMMUNITY): Payer: Self-pay | Admitting: *Deleted

## 2020-09-19 NOTE — Telephone Encounter (Signed)
Preadmission screen  

## 2020-09-20 ENCOUNTER — Telehealth (HOSPITAL_COMMUNITY): Payer: Self-pay | Admitting: *Deleted

## 2020-09-20 NOTE — Telephone Encounter (Signed)
Preadmission screen  

## 2020-10-02 ENCOUNTER — Other Ambulatory Visit (HOSPITAL_COMMUNITY)
Admission: RE | Admit: 2020-10-02 | Discharge: 2020-10-02 | Disposition: A | Discharge status: Home / Self Care | Payer: BC Managed Care – PPO | Source: Ambulatory Visit | Attending: Obstetrics and Gynecology | Admitting: Obstetrics and Gynecology

## 2020-10-02 ENCOUNTER — Other Ambulatory Visit: Payer: Self-pay | Admitting: Obstetrics and Gynecology

## 2020-10-02 DIAGNOSIS — Z20822 Contact with and (suspected) exposure to covid-19: Secondary | ICD-10-CM | POA: Insufficient documentation

## 2020-10-02 DIAGNOSIS — Z01812 Encounter for preprocedural laboratory examination: Secondary | ICD-10-CM | POA: Insufficient documentation

## 2020-10-02 LAB — SARS CORONAVIRUS 2 (TAT 6-24 HRS): SARS Coronavirus 2: NEGATIVE

## 2020-10-02 NOTE — H&P (Signed)
Keiley D Majerus is a 34 y.o. female G2P1001 (EDD 10/03/2020 by LMP c/w 9 week US)  presenting for IOL at term.  Prenatal care complicated by maternal obesity and history of hyperinsulinism with baseline HgbA1C 5.2, early glucola at 16 weeks WNL Repeat at 28 weeks 138 and 3 hour GTT WNL.    Hyperinsulinism OB History    Gravida  2   Para  1   Term  1   Preterm      AB      Living  1     SAB      IAB      Ectopic      Multiple  0   Live Births  1         11-12-2017, 40.4 wks 1. F, 7lbs 1oz, Vacuum Extraction  Past Medical History:  Diagnosis Date  . Hyperinsulinism   . Kidney stones   . PCOS (polycystic ovarian syndrome)    Past Surgical History:  Procedure Laterality Date  . CHOLECYSTECTOMY    . CYSTOSCOPY  2007  . retrograde     right retrograde pyelogram  . URETERAL STENT PLACEMENT    . URETEROLITHOTOMY     Family History: family history is not on file. Social History:  reports that she has never smoked. She has never used smokeless tobacco. She reports current drug use. She reports that she does not drink alcohol.     Maternal Diabetes: No Genetic Screening: Normal Maternal Ultrasounds/Referrals: Normal Fetal Ultrasounds or other Referrals:  None Maternal Substance Abuse:  No Significant Maternal Medications:  None Significant Maternal Lab Results:  Group B Strep negative Other Comments:  None  Review of Systems  Constitutional: Negative for fever.  Gastrointestinal: Negative for abdominal pain.  Genitourinary: Negative for vaginal bleeding.   Maternal Medical History:  Contractions: Frequency: irregular.   Perceived severity is mild.    Fetal activity: Perceived fetal activity is normal.    Prenatal complications: obesity  Prenatal Complications - Diabetes: none.      unknown if currently breastfeeding. Maternal Exam:  Uterine Assessment: Contraction strength is mild.  Contraction frequency is irregular.   Abdomen: Patient  reports no abdominal tenderness. Introitus: Normal vulva. Normal vagina.  Pelvis: adequate for delivery.      Physical Exam Cardiovascular:     Rate and Rhythm: Normal rate and regular rhythm.  Pulmonary:     Effort: Pulmonary effort is normal.  Abdominal:     Palpations: Abdomen is soft.  Genitourinary:    General: Normal vulva.  Neurological:     Mental Status: She is alert.  Psychiatric:        Mood and Affect: Mood normal.     Prenatal labs: ABO, Rh: A/Positive/-- (05/25 0000) Antibody: Negative (05/25 0000) Rubella: Immune (05/25 0000) RPR: Nonreactive (05/25 0000)  HBsAg: Negative (05/25 0000)  HIV: Non-reactive (05/25 0000)  GBS:   negative Essential panel negative 2018 NIPT low risk Hgb AA   Assessment/Plan: Pt for IOL at term with cervix that is not very favorable.  Will ripen with cytotec and start pitocin.  AROM when able.  Sora Olivo W Ahmaud Duthie 10/02/2020, 3:54 PM    

## 2020-10-02 NOTE — H&P (View-Only) (Signed)
Amber Shannon is a 34 y.o. female G2P1001 (EDD 10/03/2020 by LMP c/w 9 week [place])  presenting for IOL at term.  Prenatal care complicated by maternal obesity and history of hyperinsulinism with baseline HgbA1C 5.2, early glucola at 16 weeks WNL Repeat at 28 weeks 138 and 3 hour GTT WNL.    Hyperinsulinism OB History    Gravida  2   Para  1   Term  1   Preterm      AB      Living  1     SAB      IAB      Ectopic      Multiple  0   Live Births  1         11-12-2017, 40.4 wks 1. F, 7lbs 1oz, Vacuum Extraction  Past Medical History:  Diagnosis Date  . Hyperinsulinism   . Kidney stones   . PCOS (polycystic ovarian syndrome)    Past Surgical History:  Procedure Laterality Date  . CHOLECYSTECTOMY    . CYSTOSCOPY  2007  . retrograde     right retrograde pyelogram  . URETERAL STENT PLACEMENT    . URETEROLITHOTOMY     Family History: family history is not on file. Social History:  reports that she has never smoked. She has never used smokeless tobacco. She reports current drug use. She reports that she does not drink alcohol.     Maternal Diabetes: No Genetic Screening: Normal Maternal Ultrasounds/Referrals: Normal Fetal Ultrasounds or other Referrals:  None Maternal Substance Abuse:  No Significant Maternal Medications:  None Significant Maternal Lab Results:  Group B Strep negative Other Comments:  None  Review of Systems  Constitutional: Negative for fever.  Gastrointestinal: Negative for abdominal pain.  Genitourinary: Negative for vaginal bleeding.   Maternal Medical History:  Contractions: Frequency: irregular.   Perceived severity is mild.    Fetal activity: Perceived fetal activity is normal.    Prenatal complications: obesity  Prenatal Complications - Diabetes: none.      unknown if currently breastfeeding. Maternal Exam:  Uterine Assessment: Contraction strength is mild.  Contraction frequency is irregular.   Abdomen: Patient  reports no abdominal tenderness. Introitus: Normal vulva. Normal vagina.  Pelvis: adequate for delivery.      Physical Exam Cardiovascular:     Rate and Rhythm: Normal rate and regular rhythm.  Pulmonary:     Effort: Pulmonary effort is normal.  Abdominal:     Palpations: Abdomen is soft.  Genitourinary:    General: Normal vulva.  Neurological:     Mental Status: She is alert.  Psychiatric:        Mood and Affect: Mood normal.     Prenatal labs: ABO, Rh: A/Positive/-- (05/25 0000) Antibody: Negative (05/25 0000) Rubella: Immune (05/25 0000) RPR: Nonreactive (05/25 0000)  HBsAg: Negative (05/25 0000)  HIV: Non-reactive (05/25 0000)  GBS:   negative Essential panel negative 2018 NIPT low risk Hgb AA   Assessment/Plan: Pt for IOL at term with cervix that is not very favorable.  Will ripen with cytotec and start pitocin.  AROM when able.  Oliver Pila 10/02/2020, 3:54 PM

## 2020-10-03 ENCOUNTER — Encounter (HOSPITAL_COMMUNITY): Payer: Self-pay | Admitting: Obstetrics and Gynecology

## 2020-10-03 ENCOUNTER — Inpatient Hospital Stay (HOSPITAL_COMMUNITY): Payer: BC Managed Care – PPO | Admitting: Anesthesiology

## 2020-10-03 ENCOUNTER — Inpatient Hospital Stay (HOSPITAL_COMMUNITY)
Admission: AD | Admit: 2020-10-03 | Discharge: 2020-10-05 | DRG: 807 | Disposition: A | Discharge status: Home / Self Care | Payer: BC Managed Care – PPO | Attending: Obstetrics and Gynecology | Admitting: Obstetrics and Gynecology

## 2020-10-03 ENCOUNTER — Other Ambulatory Visit: Payer: Self-pay

## 2020-10-03 ENCOUNTER — Inpatient Hospital Stay (HOSPITAL_COMMUNITY): Payer: BC Managed Care – PPO

## 2020-10-03 DIAGNOSIS — Z3A40 40 weeks gestation of pregnancy: Secondary | ICD-10-CM | POA: Diagnosis not present

## 2020-10-03 DIAGNOSIS — O99214 Obesity complicating childbirth: Principal | ICD-10-CM | POA: Diagnosis present

## 2020-10-03 DIAGNOSIS — Z20822 Contact with and (suspected) exposure to covid-19: Secondary | ICD-10-CM | POA: Diagnosis present

## 2020-10-03 DIAGNOSIS — E161 Other hypoglycemia: Secondary | ICD-10-CM | POA: Diagnosis present

## 2020-10-03 DIAGNOSIS — O99892 Other specified diseases and conditions complicating childbirth: Secondary | ICD-10-CM | POA: Diagnosis present

## 2020-10-03 DIAGNOSIS — O26893 Other specified pregnancy related conditions, third trimester: Secondary | ICD-10-CM | POA: Diagnosis present

## 2020-10-03 LAB — CBC
HCT: 33.1 % — ABNORMAL LOW (ref 36.0–46.0)
Hemoglobin: 10.4 g/dL — ABNORMAL LOW (ref 12.0–15.0)
MCH: 24.8 pg — ABNORMAL LOW (ref 26.0–34.0)
MCHC: 31.4 g/dL (ref 30.0–36.0)
MCV: 79 fL — ABNORMAL LOW (ref 80.0–100.0)
Platelets: 184 10*3/uL (ref 150–400)
RBC: 4.19 MIL/uL (ref 3.87–5.11)
RDW: 16.6 % — ABNORMAL HIGH (ref 11.5–15.5)
WBC: 9.9 10*3/uL (ref 4.0–10.5)
nRBC: 0 % (ref 0.0–0.2)

## 2020-10-03 LAB — TYPE AND SCREEN
ABO/RH(D): A POS
Antibody Screen: NEGATIVE

## 2020-10-03 LAB — RPR: RPR Ser Ql: NONREACTIVE

## 2020-10-03 MED ORDER — BUPIVACAINE HCL (PF) 0.25 % IJ SOLN
INTRAMUSCULAR | Status: DC | PRN
  Administered 2020-10-03: 1 mL via INTRATHECAL

## 2020-10-03 MED ORDER — OXYCODONE-ACETAMINOPHEN 5-325 MG PO TABS: 1.0000 | ORAL_TABLET | ORAL | Status: DC | PRN

## 2020-10-03 MED ORDER — ACETAMINOPHEN 325 MG PO TABS: 650.0000 mg | ORAL_TABLET | ORAL | Status: DC | PRN

## 2020-10-03 MED ORDER — LACTATED RINGERS IV SOLN: INTRAVENOUS | Status: DC

## 2020-10-03 MED ORDER — OXYTOCIN-SODIUM CHLORIDE 30-0.9 UT/500ML-% IV SOLN: 2.5000 [IU]/h | INTRAVENOUS | Status: DC

## 2020-10-03 MED ORDER — OXYTOCIN BOLUS FROM INFUSION
333.0000 mL | Freq: Once | INTRAVENOUS | Status: AC
  Administered 2020-10-03: 22:00:00 333 mL via INTRAVENOUS

## 2020-10-03 MED ORDER — BUTORPHANOL TARTRATE 1 MG/ML IJ SOLN: 1.0000 mg | INTRAMUSCULAR | Status: DC | PRN

## 2020-10-03 MED ORDER — LACTATED RINGERS IV SOLN: 500.0000 mL | Freq: Once | INTRAVENOUS | Status: DC

## 2020-10-03 MED ORDER — LIDOCAINE HCL (PF) 1 % IJ SOLN: 30.0000 mL | INTRAMUSCULAR | Status: DC | PRN

## 2020-10-03 MED ORDER — OXYTOCIN-SODIUM CHLORIDE 30-0.9 UT/500ML-% IV SOLN
1.0000 m[IU]/min | INTRAVENOUS | Status: DC
  Administered 2020-10-03: 09:00:00 2 m[IU]/min via INTRAVENOUS
  Filled 2020-10-03: qty 500

## 2020-10-03 MED ORDER — SOD CITRATE-CITRIC ACID 500-334 MG/5ML PO SOLN
30.0000 mL | ORAL | Status: DC | PRN
  Administered 2020-10-03: 05:00:00 30 mL via ORAL
  Filled 2020-10-03: qty 15

## 2020-10-03 MED ORDER — FENTANYL-BUPIVACAINE-NACL 0.5-0.125-0.9 MG/250ML-% EP SOLN
EPIDURAL | Status: AC
  Filled 2020-10-03: qty 250

## 2020-10-03 MED ORDER — SODIUM CHLORIDE (PF) 0.9 % IJ SOLN
INTRAMUSCULAR | Status: DC | PRN
  Administered 2020-10-03: 10 mL/h via EPIDURAL

## 2020-10-03 MED ORDER — MISOPROSTOL 25 MCG QUARTER TABLET
25.0000 ug | ORAL_TABLET | Freq: Once | ORAL | Status: AC
  Administered 2020-10-03: 05:00:00 25 ug via VAGINAL
  Filled 2020-10-03: qty 1

## 2020-10-03 MED ORDER — LACTATED RINGERS IV SOLN: 500.0000 mL | INTRAVENOUS | Status: DC | PRN

## 2020-10-03 MED ORDER — ONDANSETRON HCL 4 MG/2ML IJ SOLN: 4.0000 mg | Freq: Four times a day (QID) | INTRAMUSCULAR | Status: DC | PRN

## 2020-10-03 MED ORDER — TERBUTALINE SULFATE 1 MG/ML IJ SOLN: 0.2500 mg | Freq: Once | INTRAMUSCULAR | Status: DC | PRN

## 2020-10-03 MED ORDER — OXYCODONE-ACETAMINOPHEN 5-325 MG PO TABS: 2.0000 | ORAL_TABLET | ORAL | Status: DC | PRN

## 2020-10-03 NOTE — Anesthesia Procedure Notes (Signed)
Epidural Patient location during procedure: OB Start time: 10/03/2020 12:57 PM End time: 10/03/2020 1:01 PM  Staffing Anesthesiologist: Kaylyn Layer, MD Performed: anesthesiologist   Preanesthetic Checklist Completed: patient identified, IV checked, risks and benefits discussed, monitors and equipment checked, pre-op evaluation and timeout performed  Epidural Patient position: sitting Prep: DuraPrep and site prepped and draped Patient monitoring: heart rate, continuous pulse ox and blood pressure Approach: midline Location: L3-L4 Injection technique: LOR air  Needle:  Needle type: Tuohy  Needle gauge: 17 G Needle length: 9 cm Needle insertion depth: 8 cm Catheter type: closed end flexible Catheter size: 19 Gauge Catheter at skin depth: 13 cm  Assessment Events: blood not aspirated, injection not painful, no injection resistance, no paresthesia and negative IV test  Additional Notes Patient identified. Risks, benefits, and alternatives discussed with patient including but not limited to bleeding, infection, nerve damage, paralysis, failed block, incomplete pain control, headache, blood pressure changes, nausea, vomiting, reactions to medication, itching, and postpartum back pain. Confirmed with bedside nurse the patient's most recent platelet count. Confirmed with patient that they are not currently taking any anticoagulation, have any bleeding history, or any family history of bleeding disorders. Patient expressed understanding and wished to proceed. All questions were answered. Sterile technique was used throughout the entire procedure. Please see nursing notes for vital signs.   Crisp LOR with Tuohy needle on second attempt (somewhat difficult due to habitus). [place] 25g spinal needle introduced through Tuohy with clear CSF return prior to injection of intrathecal medication. Spinal needle withdrawn and epidural catheter threaded easily. Negative aspiration of  catheter for heme or CSF prior to starting epidural infusion. Warning signs of high block given to the patient including shortness of breath, tingling/numbness in hands, complete motor block, or any concerning symptoms with instructions to call for help. Patient was given instructions on fall risk and not to get out of bed. All questions and concerns addressed with instructions to call with any issues or inadequate analgesia.  Patient comfortable with contractions prior to my leaving room. Reason for block:procedure for pain

## 2020-10-03 NOTE — Progress Notes (Signed)
Patient ID: Amber Shannon, female   DOB: 1985-12-18, 34 y.o.   MRN: [id number] Pt feeling more pressure.   Cervix ant lip/c/0 station  Due to pt discomfort lip reduced and pt attempted pushing with some movement but not great descent.  FHR had variable decels to 80-90 with pushing, so stopped and improved back to baseline with only mild early decels with contractions.  Will reassess station in next hour or increasing discomfort

## 2020-10-03 NOTE — Progress Notes (Signed)
Patient ID: Amber Shannon, female   DOB: [birth year], 34 y.o.   MRN: [id number] Pt feeling some rectal pressure.    FHR category 1  Cervix  7/80/-1  Feels OP and rotating, placed in exaggerated sims and will follow progress

## 2020-10-03 NOTE — OB Triage Note (Signed)
Pt presents for term labor induction at 40.0  Weeks.  Pt denies health history.  No SROM, Bleeding, headache, dizziness, or blurry vision.  + FM with occasional contractions.  SVE per pt on 09/25/20 1 cm.  To be induced with misoprostol/

## 2020-10-03 NOTE — Interval H&P Note (Signed)
History and Physical Interval Note:  10/03/2020 10:11 AM  Amber Shannon came in about 0300am to begin IOL.  Received cytotec x 1 with minimal contractions felt, but cervix now 2cm/40/-2 FHR category 1.  AROM with FSE done given body habitus and left in place.  Also IUPC placed as hard to trace contractions.  Follow progress.  Pitocin started and epidural prn. Oliver Pila

## 2020-10-03 NOTE — Progress Notes (Signed)
Patient ID: Amber Shannon, female   DOB: June 09, 1986, 34 y.o.   MRN: [id number]   Pt became uncomfortable and just received epidural, better now.    afeb VSS FHR category 1   3-4/70/-2  Continue pitocin induction and follow progress.

## 2020-10-03 NOTE — Anesthesia Preprocedure Evaluation (Signed)
Anesthesia Evaluation  Patient identified by MRN, date of birth, ID band Patient awake    Reviewed: Allergy & Precautions, Patient's Chart, lab work & pertinent test results  History of Anesthesia Complications Negative for: history of anesthetic complications  Airway Mallampati: II  TM Distance: >3 FB Neck ROM: Full    Dental no notable dental hx.    Pulmonary neg pulmonary ROS,    Pulmonary exam normal        Cardiovascular negative cardio ROS Normal cardiovascular exam     Neuro/Psych negative neurological ROS  negative psych ROS   GI/Hepatic negative GI ROS, Neg liver ROS,   Endo/Other  Morbid obesity  Renal/GU negative Renal ROS  negative genitourinary   Musculoskeletal negative musculoskeletal ROS (+)   Abdominal   Peds  Hematology negative hematology ROS (+)   Anesthesia Other Findings Day of surgery medications reviewed with patient.  Reproductive/Obstetrics (+) Pregnancy                             Anesthesia Physical Anesthesia Plan  ASA: III  Anesthesia Plan: Epidural   Post-op Pain Management:    Induction:   PONV Risk Score and Plan: Treatment may vary due to age or medical condition  Airway Management Planned: Natural Airway  Additional Equipment:   Intra-op Plan:   Post-operative Plan:   Informed Consent: I have reviewed the patients History and Physical, chart, labs and discussed the procedure including the risks, benefits and alternatives for the proposed anesthesia with the patient or authorized representative who has indicated his/her understanding and acceptance.       Plan Discussed with:   Anesthesia Plan Comments:         Anesthesia Quick Evaluation  

## 2020-10-04 LAB — CBC
HCT: 29.3 % — ABNORMAL LOW (ref 36.0–46.0)
Hemoglobin: 9.7 g/dL — ABNORMAL LOW (ref 12.0–15.0)
MCH: 25.7 pg — ABNORMAL LOW (ref 26.0–34.0)
MCHC: 33.1 g/dL (ref 30.0–36.0)
MCV: 77.7 fL — ABNORMAL LOW (ref 80.0–100.0)
Platelets: 174 10*3/uL (ref 150–400)
RBC: 3.77 MIL/uL — ABNORMAL LOW (ref 3.87–5.11)
RDW: 16.7 % — ABNORMAL HIGH (ref 11.5–15.5)
WBC: 15.1 10*3/uL — ABNORMAL HIGH (ref 4.0–10.5)
nRBC: 0 % (ref 0.0–0.2)

## 2020-10-04 MED ORDER — BENZOCAINE-MENTHOL 20-0.5 % EX AERO: 1.0000 "application " | INHALATION_SPRAY | CUTANEOUS | Status: DC | PRN

## 2020-10-04 MED ORDER — IBUPROFEN 600 MG PO TABS
600.0000 mg | ORAL_TABLET | Freq: Four times a day (QID) | ORAL | Status: DC
  Administered 2020-10-04 – 2020-10-05 (×6): 600 mg via ORAL
  Filled 2020-10-04 (×6): qty 1

## 2020-10-04 MED ORDER — ACETAMINOPHEN 325 MG PO TABS
650.0000 mg | ORAL_TABLET | ORAL | Status: DC | PRN
  Administered 2020-10-04: 650 mg via ORAL
  Filled 2020-10-04: qty 2

## 2020-10-04 MED ORDER — DIBUCAINE (PERIANAL) 1 % EX OINT: 1.0000 "application " | TOPICAL_OINTMENT | CUTANEOUS | Status: DC | PRN

## 2020-10-04 MED ORDER — ONDANSETRON HCL 4 MG PO TABS: 4.0000 mg | ORAL_TABLET | ORAL | Status: DC | PRN

## 2020-10-04 MED ORDER — PRENATAL MULTIVITAMIN CH
1.0000 | ORAL_TABLET | Freq: Every day | ORAL | Status: DC
  Administered 2020-10-04: 12:00:00 1 via ORAL
  Filled 2020-10-04: qty 1

## 2020-10-04 MED ORDER — COCONUT OIL OIL: 1.0000 "application " | TOPICAL_OIL | Status: DC | PRN

## 2020-10-04 MED ORDER — DIPHENHYDRAMINE HCL 25 MG PO CAPS: 25.0000 mg | ORAL_CAPSULE | Freq: Four times a day (QID) | ORAL | Status: DC | PRN

## 2020-10-04 MED ORDER — WITCH HAZEL-GLYCERIN EX PADS: 1.0000 "application " | MEDICATED_PAD | CUTANEOUS | Status: DC | PRN

## 2020-10-04 MED ORDER — SIMETHICONE 80 MG PO CHEW: 80.0000 mg | CHEWABLE_TABLET | ORAL | Status: DC | PRN

## 2020-10-04 MED ORDER — SENNOSIDES-DOCUSATE SODIUM 8.6-50 MG PO TABS
2.0000 | ORAL_TABLET | ORAL | Status: DC
  Administered 2020-10-04 (×2): 2 via ORAL
  Filled 2020-10-04 (×2): qty 2

## 2020-10-04 MED ORDER — ZOLPIDEM TARTRATE 5 MG PO TABS: 5.0000 mg | ORAL_TABLET | Freq: Every evening | ORAL | Status: DC | PRN

## 2020-10-04 MED ORDER — TETANUS-DIPHTH-ACELL PERTUSSIS 5-2.5-18.5 LF-MCG/0.5 IM SUSY: 0.5000 mL | PREFILLED_SYRINGE | Freq: Once | INTRAMUSCULAR | Status: DC

## 2020-10-04 MED ORDER — ONDANSETRON HCL 4 MG/2ML IJ SOLN: 4.0000 mg | INTRAMUSCULAR | Status: DC | PRN

## 2020-10-04 NOTE — Anesthesia Postprocedure Evaluation (Signed)
Anesthesia Post Note  Patient: Amber Shannon  Procedure(s) Performed: AN AD HOC LABOR EPIDURAL     Patient location during evaluation: Mother Baby Anesthesia Type: Epidural Level of consciousness: awake and alert Pain management: pain level controlled Vital Signs Assessment: post-procedure vital signs reviewed and stable Respiratory status: spontaneous breathing, nonlabored ventilation and respiratory function stable Cardiovascular status: stable Postop Assessment: no headache, no backache and epidural receding Anesthetic complications: no   No complications documented.  Last Vitals:  Vitals:   10/04/20 0100 10/04/20 0515  BP: (!) 124/57 129/83  Pulse: 93 65  Resp: 18 18  Temp: 36.9 C 36.4 C  SpO2: 98% 100%    Last Pain:  Vitals:   10/04/20 0515  TempSrc: Oral  PainSc:    Pain Goal: Patients Stated Pain Goal: 2 (10/04/20 0100)                 Fanny Dance

## 2020-10-04 NOTE — Progress Notes (Signed)
Patient ID: Amber Shannon, female   DOB: 1986-10-05, 34 y.o.   MRN: [id number] Pt doing well with no complaints. Ambulating with no difficulty, Voiding well, +BM, pain well controlled. Denies HA, CP, SOB. She is bonding well with baby - bottlefeeding. Has no complaints. Feels better than after last delivery VSS GEN - NAD ABD - FF and 2cm below umbilicus EXT - +2 pitting edema to just below knees bilaterally; no homans   15.1>9.7<175  A/P: PPD#1 s/p svd - stable          Routine pp care         Likely discharge to home tomorrow

## 2020-10-05 MED ORDER — IBUPROFEN 800 MG PO TABS: 800.0000 mg | ORAL_TABLET | Freq: Three times a day (TID) | ORAL | 1 refills | Status: AC | PRN

## 2020-10-05 NOTE — Discharge Summary (Signed)
Postpartum Discharge Summary  Date of Service updated     Patient Name: Amber Shannon DOB: April 20, 1986 MRN: [id number]  Date of admission: 10/03/2020 Delivery date:10/03/2020  Delivering provider: Huel Cote  Date of discharge: 10/05/2020  Admitting diagnosis: Indication for care in labor and delivery, antepartum [O75.9] NSVD (normal spontaneous vaginal delivery) [O80] Intrauterine pregnancy: [redacted]w[redacted]d     Secondary diagnosis:  Active Problems:   Indication for care in labor and delivery, antepartum   NSVD (normal spontaneous vaginal delivery)  Additional problems: none    Discharge diagnosis: Term Pregnancy Delivered                                              Post partum procedures:none Augmentation: AROM, Pitocin and Cytotec Complications: None  Hospital course: Induction of Labor With Vaginal Delivery   34 y.o. yo [id number] at [redacted]w[redacted]d was admitted to the hospital 10/03/2020 for induction of labor.  Indication for induction: Favorable cervix at term.  Patient had an uncomplicated labor course as follows: Membrane Rupture Time/Date: 9:45 AM ,10/03/2020   Delivery Method:Vaginal, Spontaneous  Episiotomy: None  Lacerations:  2nd degree  Details of delivery can be found in separate delivery note.  Patient had a routine postpartum course. Patient is discharged home 10/05/20.  Newborn Data: Birth date:10/03/2020  Birth time:9:49 PM  Gender:Female  Living status:Living  Apgars:8 ,9  Weight:3581 g     Physical exam  Vitals:   10/04/20 0515 10/04/20 1336 10/04/20 2226 10/05/20 0540  BP: 129/83 111/61 129/82 123/77  Pulse: 65 89 83 67  Resp: 18 18 18 17   Temp: 97.6 F (36.4 C) 98.2 F (36.8 C) 98.6 F (37 C) 98.5 F (36.9 C)  TempSrc: Oral Oral Oral Oral  SpO2: 100% 99% 100% 100%  Weight:      Height:       General: alert, cooperative and no distress Lochia: appropriate Uterine Fundus: firm Incision: N/A DVT Evaluation: No evidence of DVT seen on  physical exam. Negative Homan's sign. No cords or calf tenderness. Labs: Lab Results  Component Value Date   WBC 15.1 (H) 10/04/2020   HGB 9.7 (L) 10/04/2020   HCT 29.3 (L) 10/04/2020   MCV 77.7 (L) 10/04/2020   PLT 174 10/04/2020   CMP Latest Ref Rng & Units 11/12/2017  Glucose 65 - 99 mg/dL 95  BUN 6 - 20 mg/dL 13  Creatinine 01/10/2018 - 7.84 mg/dL 6.96  Sodium 2.95 - 284 mmol/L 133(L)  Potassium 3.5 - 5.1 mmol/L 3.8  Chloride 101 - 111 mmol/L 105  CO2 22 - 32 mmol/L 18(L)  Calcium 8.9 - 10.3 mg/dL 132)  Total Protein 6.5 - 8.1 g/dL 7.0  Total Bilirubin 0.3 - 1.2 mg/dL 0.4  Alkaline Phos 38 - 126 U/L 143(H)  AST 15 - 41 U/L 17  ALT 14 - 54 U/L 17   Edinburgh Score: Edinburgh Postnatal Depression Scale Screening Tool 10/05/2020  I have been able to laugh and see the funny side of things. 0  I have looked forward with enjoyment to things. 0  I have blamed myself unnecessarily when things went wrong. 0  I have been anxious or worried for no good reason. 0  I have felt scared or panicky for no good reason. 0  Things have been getting on top of me. 0  I have been so unhappy that  I have had difficulty sleeping. 0  I have felt sad or miserable. 0  I have been so unhappy that I have been crying. 0  The thought of harming myself has occurred to me. 0  Edinburgh Postnatal Depression Scale Total 0      After visit meds:  Allergies as of 10/05/2020   No Known Allergies     Medication List    STOP taking these medications   acetaminophen 500 MG tablet Commonly known as: TYLENOL   docusate sodium 100 MG capsule Commonly known as: COLACE     TAKE these medications   ferrous sulfate 325 (65 FE) MG tablet Take 325 mg by mouth daily with breakfast.   ibuprofen 800 MG tablet Commonly known as: ADVIL Take 1 tablet (800 mg total) by mouth every 8 (eight) hours as needed. What changed:   medication strength  how much to take  when to take this   PNV PO Take 1 tablet  by mouth daily.        Discharge home in stable condition Infant Feeding: Bottle Infant Disposition:home with mother Discharge instruction: per After Visit Summary and Postpartum booklet. Activity: Advance as tolerated. Pelvic rest for 6 weeks.  Diet: routine diet Anticipated Birth Control: IUD, which unsure Postpartum Appointment:6 weeks Future Appointments:No future appointments. Follow up Visit:      10/05/2020 Carlisle Cater, MD

## 2020-10-05 NOTE — Progress Notes (Signed)
Patient ID: Amber Shannon, female   DOB: Mar 30, 1986, 34 y.o.   MRN: [id number] Pt doing well with no complaints. Ambulating with no difficulty, Voiding well, +BM, pain well controlled. Denies HA, CP, SOB. She is bonding well with baby - bottlefeeding. Has no complaints. VSS GEN - NAD ABD - FF and 2cm below umbilicus EXT - trace edema to just below knees bilaterally; no homans , improved from yesterday  15.1>9.7<175  A/P: PPD# s/p svd - stable          Routine pp care         DC home

## 2023-09-15 ENCOUNTER — Other Ambulatory Visit: Payer: Self-pay | Admitting: Obstetrics and Gynecology

## 2023-09-15 DIAGNOSIS — N6320 Unspecified lump in the left breast, unspecified quadrant: Secondary | ICD-10-CM

## 2023-09-17 ENCOUNTER — Inpatient Hospital Stay
Admission: RE | Admit: 2023-09-17 | Discharge: 2023-09-17 | Discharge status: Home / Self Care | Payer: BC Managed Care – PPO | Source: Ambulatory Visit | Attending: Obstetrics and Gynecology | Admitting: Obstetrics and Gynecology

## 2023-09-17 ENCOUNTER — Ambulatory Visit
Admission: RE | Admit: 2023-09-17 | Discharge: 2023-09-17 | Disposition: A | Discharge status: Home / Self Care | Payer: BC Managed Care – PPO | Source: Ambulatory Visit | Attending: Obstetrics and Gynecology | Admitting: Obstetrics and Gynecology

## 2023-09-17 DIAGNOSIS — N6320 Unspecified lump in the left breast, unspecified quadrant: Secondary | ICD-10-CM
